# Patient Record
Sex: Female | Born: 2000 | Race: White | Hispanic: No | Marital: Single | Smoking: Former smoker
Health system: Southern US, Community
[De-identification: ages and names within clinical notes are randomized; demographics above are authoritative.]

## PROBLEM LIST (undated history)

## (undated) DIAGNOSIS — F419 Anxiety disorder, unspecified: Secondary | ICD-10-CM

## (undated) DIAGNOSIS — F329 Major depressive disorder, single episode, unspecified: Secondary | ICD-10-CM

## (undated) DIAGNOSIS — E162 Hypoglycemia, unspecified: Secondary | ICD-10-CM

## (undated) DIAGNOSIS — F5002 Anorexia nervosa, binge eating/purging type: Secondary | ICD-10-CM

## (undated) DIAGNOSIS — F32A Depression, unspecified: Secondary | ICD-10-CM

## (undated) DIAGNOSIS — F509 Eating disorder, unspecified: Secondary | ICD-10-CM

## (undated) DIAGNOSIS — N938 Other specified abnormal uterine and vaginal bleeding: Secondary | ICD-10-CM

## (undated) DIAGNOSIS — F50029 Anorexia nervosa, binge eating/purging type, unspecified: Secondary | ICD-10-CM

## (undated) DIAGNOSIS — N83209 Unspecified ovarian cyst, unspecified side: Secondary | ICD-10-CM

## (undated) DIAGNOSIS — G43909 Migraine, unspecified, not intractable, without status migrainosus: Secondary | ICD-10-CM

## (undated) DIAGNOSIS — F909 Attention-deficit hyperactivity disorder, unspecified type: Secondary | ICD-10-CM

## (undated) HISTORY — PX: INTRAUTERINE DEVICE INSERTION: SHX323

## (undated) HISTORY — PX: TYMPANOSTOMY TUBE PLACEMENT: SHX32

---
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
## 2020-01-29 ENCOUNTER — Other Ambulatory Visit (INDEPENDENT_AMBULATORY_CARE_PROVIDER_SITE_OTHER): Payer: Self-pay | Admitting: Family

## 2020-01-29 DIAGNOSIS — G43009 Migraine without aura, not intractable, without status migrainosus: Secondary | ICD-10-CM

## 2020-03-30 ENCOUNTER — Other Ambulatory Visit (INDEPENDENT_AMBULATORY_CARE_PROVIDER_SITE_OTHER): Payer: Self-pay | Admitting: Family

## 2020-03-30 DIAGNOSIS — G43009 Migraine without aura, not intractable, without status migrainosus: Secondary | ICD-10-CM

## 2020-04-28 ENCOUNTER — Other Ambulatory Visit (INDEPENDENT_AMBULATORY_CARE_PROVIDER_SITE_OTHER): Payer: Self-pay | Admitting: Family

## 2020-04-28 ENCOUNTER — Other Ambulatory Visit (INDEPENDENT_AMBULATORY_CARE_PROVIDER_SITE_OTHER): Payer: Self-pay

## 2020-04-28 DIAGNOSIS — G43009 Migraine without aura, not intractable, without status migrainosus: Secondary | ICD-10-CM

## 2020-06-05 ENCOUNTER — Other Ambulatory Visit (INDEPENDENT_AMBULATORY_CARE_PROVIDER_SITE_OTHER): Payer: Self-pay | Admitting: Family

## 2020-06-05 DIAGNOSIS — G43009 Migraine without aura, not intractable, without status migrainosus: Secondary | ICD-10-CM

## 2020-06-05 NOTE — Telephone Encounter (Signed)
Please send refill to pharmacy if applicable

## 2020-07-16 ENCOUNTER — Other Ambulatory Visit (INDEPENDENT_AMBULATORY_CARE_PROVIDER_SITE_OTHER): Payer: Self-pay | Admitting: Family

## 2020-07-16 DIAGNOSIS — G43009 Migraine without aura, not intractable, without status migrainosus: Secondary | ICD-10-CM

## 2020-09-21 ENCOUNTER — Other Ambulatory Visit (INDEPENDENT_AMBULATORY_CARE_PROVIDER_SITE_OTHER): Payer: Self-pay | Admitting: Family

## 2020-09-21 DIAGNOSIS — G43009 Migraine without aura, not intractable, without status migrainosus: Secondary | ICD-10-CM

## 2020-10-19 ENCOUNTER — Other Ambulatory Visit (INDEPENDENT_AMBULATORY_CARE_PROVIDER_SITE_OTHER): Payer: Self-pay | Admitting: Family

## 2020-10-19 DIAGNOSIS — G43009 Migraine without aura, not intractable, without status migrainosus: Secondary | ICD-10-CM

## 2021-02-25 ENCOUNTER — Encounter (INDEPENDENT_AMBULATORY_CARE_PROVIDER_SITE_OTHER): Payer: Self-pay

## 2021-02-26 ENCOUNTER — Encounter (HOSPITAL_BASED_OUTPATIENT_CLINIC_OR_DEPARTMENT_OTHER): Payer: Self-pay | Admitting: *Deleted

## 2021-02-26 ENCOUNTER — Other Ambulatory Visit: Payer: Self-pay

## 2021-02-26 ENCOUNTER — Emergency Department (HOSPITAL_BASED_OUTPATIENT_CLINIC_OR_DEPARTMENT_OTHER)
Admission: EM | Admit: 2021-02-26 | Discharge: 2021-02-26 | Disposition: A | Discharge status: Home / Self Care | Payer: Medicaid Other | Attending: Emergency Medicine | Admitting: Emergency Medicine

## 2021-02-26 DIAGNOSIS — F17210 Nicotine dependence, cigarettes, uncomplicated: Secondary | ICD-10-CM | POA: Diagnosis not present

## 2021-02-26 DIAGNOSIS — T2040XA Corrosion of unspecified degree of head, face, and neck, unspecified site, initial encounter: Secondary | ICD-10-CM | POA: Diagnosis not present

## 2021-02-26 DIAGNOSIS — T304 Corrosion of unspecified body region, unspecified degree: Secondary | ICD-10-CM

## 2021-02-26 DIAGNOSIS — L539 Erythematous condition, unspecified: Secondary | ICD-10-CM | POA: Insufficient documentation

## 2021-02-26 DIAGNOSIS — T656X1A Toxic effect of paints and dyes, not elsewhere classified, accidental (unintentional), initial encounter: Secondary | ICD-10-CM | POA: Diagnosis not present

## 2021-02-26 DIAGNOSIS — X58XXXA Exposure to other specified factors, initial encounter: Secondary | ICD-10-CM | POA: Insufficient documentation

## 2021-02-26 DIAGNOSIS — Y929 Unspecified place or not applicable: Secondary | ICD-10-CM | POA: Insufficient documentation

## 2021-02-26 MED ORDER — TRIAMCINOLONE ACETONIDE 0.1 % EX CREA: 1.0000 "application " | TOPICAL_CREAM | Freq: Two times a day (BID) | CUTANEOUS | 0 refills | Status: AC
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
## 2021-03-20 ENCOUNTER — Emergency Department (HOSPITAL_BASED_OUTPATIENT_CLINIC_OR_DEPARTMENT_OTHER)
Admission: EM | Admit: 2021-03-20 | Discharge: 2021-03-20 | Disposition: A | Discharge status: Home / Self Care | Payer: Medicaid Other | Attending: Emergency Medicine | Admitting: Emergency Medicine

## 2021-03-20 ENCOUNTER — Other Ambulatory Visit: Payer: Self-pay

## 2021-03-20 ENCOUNTER — Emergency Department (HOSPITAL_BASED_OUTPATIENT_CLINIC_OR_DEPARTMENT_OTHER): Payer: Medicaid Other

## 2021-03-20 ENCOUNTER — Encounter (HOSPITAL_BASED_OUTPATIENT_CLINIC_OR_DEPARTMENT_OTHER): Payer: Self-pay | Admitting: *Deleted

## 2021-03-20 DIAGNOSIS — R112 Nausea with vomiting, unspecified: Secondary | ICD-10-CM | POA: Insufficient documentation

## 2021-03-20 DIAGNOSIS — F17210 Nicotine dependence, cigarettes, uncomplicated: Secondary | ICD-10-CM | POA: Insufficient documentation

## 2021-03-20 DIAGNOSIS — G43001 Migraine without aura, not intractable, with status migrainosus: Secondary | ICD-10-CM

## 2021-03-20 DIAGNOSIS — R519 Headache, unspecified: Secondary | ICD-10-CM | POA: Diagnosis not present

## 2021-03-20 MED ORDER — SODIUM CHLORIDE 0.9 % IV BOLUS
1000.0000 mL | Freq: Once | INTRAVENOUS | Status: AC
  Administered 2021-03-20: 1000 mL via INTRAVENOUS

## 2021-03-20 MED ORDER — HYDROMORPHONE HCL 1 MG/ML IJ SOLN
0.5000 mg | Freq: Once | INTRAMUSCULAR | Status: AC
  Administered 2021-03-20: 0.5 mg via INTRAVENOUS
  Filled 2021-03-20: qty 1

## 2021-03-20 MED ORDER — PROMETHAZINE HCL 25 MG/ML IJ SOLN
INTRAMUSCULAR | Status: AC
  Administered 2021-03-20: 12.5 mg
  Filled 2021-03-20: qty 1

## 2021-03-20 MED ORDER — SUMATRIPTAN SUCCINATE 6 MG/0.5ML ~~LOC~~ SOLN
6.0000 mg | Freq: Once | SUBCUTANEOUS | Status: AC
  Administered 2021-03-20: 6 mg via SUBCUTANEOUS
  Filled 2021-03-20: qty 0.5

## 2021-03-20 MED ORDER — ONDANSETRON HCL 4 MG/2ML IJ SOLN
4.0000 mg | Freq: Once | INTRAMUSCULAR | Status: AC
  Administered 2021-03-20: 4 mg via INTRAVENOUS
  Filled 2021-03-20: qty 2

## 2021-03-20 MED ORDER — LORAZEPAM 2 MG/ML IJ SOLN
0.5000 mg | Freq: Once | INTRAMUSCULAR | Status: AC
  Administered 2021-03-20: 0.5 mg via INTRAVENOUS
  Filled 2021-03-20: qty 1

## 2021-03-20 MED ORDER — KETOROLAC TROMETHAMINE 30 MG/ML IJ SOLN
15.0000 mg | Freq: Once | INTRAMUSCULAR | Status: AC
  Administered 2021-03-20: 15 mg via INTRAVENOUS
  Filled 2021-03-20: qty 1

## 2021-03-20 MED ORDER — SODIUM CHLORIDE 0.9 % IV SOLN
12.5000 mg | Freq: Once | INTRAVENOUS | Status: DC
  Filled 2021-03-20: qty 0.5

## 2021-03-20 NOTE — ED Notes (Signed)
Patient transported to CT 

## 2021-03-20 NOTE — ED Provider Notes (Addendum)
[facility] EMERGENCY DEPARTMENT Provider Note   CSN: [id number] Arrival date & time: 03/20/21  2016     History Chief Complaint  Patient presents with  . Headache    Amanda Golden is a 20 y.o. female.  Patient with hx migraines c/o dull frontal headache, c/w prior migraines, onset two days ago. Symptoms acute onset, moderate, persistent, constant. No abrupt, thunderclap, or worst headache type of headaches. No neck pain or stiffness. No sinus drainage or sinus pain. No sore throat or runny nose. No cough. +nausea. States vomited earlier, not bloody or bilious. No abd pain or diarrhea. Tried otc meds without relief. No fever or chills. No recent head trauma or injury. No syncope. No eye pain or change in vision. No neck pain or stiffness. No associated numbness or weakness. No problems w balance, coordination or normal functional ability.   The history is provided by the patient.  Headache Associated symptoms: nausea and vomiting   Associated symptoms: no abdominal pain, no back pain, no eye pain, no fever, no neck pain, no neck stiffness, no numbness and no weakness        Past Medical History:  Diagnosis Date  . ADHD (attention deficit hyperactivity disorder)   . Anorexia nervosa with bulimia   . Anxiety   . Depression   . DUB (dysfunctional uterine bleeding)   . Eating disorder   . Hypoglycemia   . Migraines   . Ovarian cyst     Patient Active Problem List   Diagnosis Date Noted  . History of suicidal ideation 09/30/2017  . Depression 09/25/2017  . Migraine without aura and without status migrainosus, not intractable 07/25/2017  . Tension type headache 07/25/2017  . History of cholesteatoma left ear 07/25/2017  . Disrupted sleep-wake cycle 07/25/2017  . Anxiety 07/25/2017  . Eating disorder 07/25/2017  . ADHD (attention deficit hyperactivity disorder) 07/25/2017  . Problems with learning 07/25/2017    Past Surgical History:  Procedure Laterality Date   . INTRAUTERINE DEVICE INSERTION    . TYMPANOSTOMY TUBE PLACEMENT       OB History   No obstetric history on file.     Family History  Problem Relation Age of Onset  . Migraines Mother   . ADD / ADHD Mother   . Migraines Father   . Bipolar disorder Father   . ADD / ADHD Father   . Migraines Maternal Grandmother   . Bipolar disorder Maternal Grandmother   . ADD / ADHD Maternal Grandmother   . Migraines Maternal Grandfather   . Migraines Paternal Grandmother   . ADD / ADHD Paternal Grandmother     Social History   Tobacco Use  . Smoking status: Current Every Day Smoker    Types: Cigarettes  . Smokeless tobacco: Never Used  Vaping Use  . Vaping Use: Every day  Substance Use Topics  . Alcohol use: Not Currently  . Drug use: Never    Home Medications Prior to Admission medications   Medication Sig Start Date End Date Taking? Authorizing Provider  acetaminophen (TYLENOL) 325 MG tablet Take 2 tablets (650 mg total) by mouth every 6 (six) hours as needed. 09/25/18   Mesner, Barbara CowerJason, MD  albuterol (VENTOLIN HFA) 108 (90 Base) MCG/ACT inhaler Inhale into the lungs. 09/24/19   [provider]  cetirizine (ZYRTEC) 10 MG tablet Take 10 mg by mouth daily as needed. 11/04/19   [provider]  Dexmethylphenidate HCl (FOCALIN XR) 30 MG CP24 Take 2 capsules every morning  09/25/17   Elveria Rising, NP  DULoxetine (CYMBALTA) 30 MG capsule TAKE 1 CAPSULE BY MOUTH ONCE DAILY 07/15/19   [provider]  esomeprazole (NEXIUM) 20 MG capsule Take by mouth.    [provider]  etonogestrel (NEXPLANON) 68 MG IMPL implant 1 each by Subdermal route once.    [provider]  ibuprofen (ADVIL) 400 MG tablet Take 1 tablet (400 mg total) by mouth as needed. 04/29/19   Elveria Rising, NP  lactulose (CHRONULAC) 10 GM/15ML solution Take by mouth 3 (three) times daily.    [provider]  loratadine (CLARITIN) 10 MG tablet Take 10 mg by mouth daily.     [provider]  magnesium hydroxide (MILK OF MAGNESIA) 400 MG/5ML suspension Take by mouth. 09/05/17   [provider]  ondansetron (ZOFRAN-ODT) 4 MG disintegrating tablet DISSOLVE 1 TABLET IN MOUTH AT ONSET OF NAUSEA, MAY REPEAT 1 TABLET IN 6 HOURS AS NEEDED 07/16/20   Elveria Rising, NP  promethazine (PHENERGAN) 25 MG tablet TAKE 1 TABLET BY MOUTH EVERY 6 HOURS AS NEEDED FOR NAUSEA AND VOMITING . APPOINTMENT REQUIRED FOR FUTURE REFILLS 10/19/20   Elveria Rising, NP  tiZANidine (ZANAFLEX) 4 MG tablet TAKE 1 TABLET BY MOUTH AT BEDTIME WHEN HEADACHE IS SEVERE 07/16/20   Elveria Rising, NP  triamcinolone cream (KENALOG) 0.1 % Apply 1 application topically 2 (two) times daily. Do not use on face for more than 5 days 02/26/21   Renne Crigler, PA-C    Allergies    Morphine and related, Compazine [prochlorperazine], Decadron [dexamethasone], and Metoclopramide  Review of Systems   Review of Systems  Constitutional: Negative for chills and fever.  Eyes: Negative for pain, redness and visual disturbance.  Respiratory: Negative for shortness of breath.   Cardiovascular: Negative for chest pain.  Gastrointestinal: Positive for nausea and vomiting. Negative for abdominal pain.  Genitourinary: Negative for flank pain.  Musculoskeletal: Negative for back pain, neck pain and neck stiffness.  Skin: Negative for rash.  Neurological: Positive for headaches. Negative for syncope, speech difficulty, weakness and numbness.  Hematological: Does not bruise/bleed easily.  Psychiatric/Behavioral: Negative for confusion.    Physical Exam Updated Vital Signs BP 115/76 (BP Location: Right Arm)   Pulse 68   Temp 97.9 F (36.6 C) (Oral)   Resp 16   Ht 1.549 m (5\' 1" )   Wt 61.2 kg   LMP 03/06/2021 (Approximate)   SpO2 100%   BMI 25.51 kg/m   Physical Exam Vitals and nursing note reviewed.  Constitutional:      General: She is not in acute distress.    Appearance: Normal  appearance. She is well-developed. She is not diaphoretic.  HENT:     Head: Atraumatic.     Comments: No sinus or temporal tenderness.     Nose: Nose normal.     Mouth/Throat:     Mouth: Mucous membranes are moist.  Eyes:     General: No scleral icterus.    Extraocular Movements: Extraocular movements intact.     Conjunctiva/sclera: Conjunctivae normal.     Pupils: Pupils are equal, round, and reactive to light.  Neck:     Thyroid: No thyromegaly.     Trachea: No tracheal deviation.     Comments: No stiffness or rigidity.  Cardiovascular:     Rate and Rhythm: Normal rate and regular rhythm.     Pulses: Normal pulses.     Heart sounds: Normal heart sounds. No murmur heard. No friction rub. No gallop.  Pulmonary:     Effort: Pulmonary effort is normal. No respiratory distress.     Breath sounds: Normal breath sounds.  Abdominal:     General: Bowel sounds are normal. There is no distension.     Palpations: Abdomen is soft.     Tenderness: There is no abdominal tenderness.  Genitourinary:    Comments: No cva tenderness.  Musculoskeletal:        General: No swelling or tenderness. Normal range of motion.     Cervical back: Normal range of motion and neck supple. No rigidity. No muscular tenderness.  Skin:    General: Skin is warm and dry.     Findings: No rash.  Neurological:     General: No focal deficit present.     Mental Status: She is alert and oriented to person, place, and time.     Cranial Nerves: No cranial nerve deficit.     Comments: Alert, speech normal. Motor intact bil, stre 5/5. Sens grossly intact bil. Steady gait.   Psychiatric:        Mood and Affect: Mood normal.     ED Results / Procedures / Treatments   Labs (all labs ordered are listed, but only abnormal results are displayed) Labs Reviewed - No data to display  EKG None  Radiology CT HEAD WO CONTRAST  Result Date: 03/20/2021 CLINICAL DATA:  Worsening headache for 2 days, vomiting EXAM: CT  HEAD WITHOUT CONTRAST TECHNIQUE: Contiguous axial images were obtained from the base of the skull through the vertex without intravenous contrast. COMPARISON:  09/28/2017 FINDINGS: Brain: No acute infarct or hemorrhage. Lateral ventricles and midline structures are unremarkable. No acute extra-axial fluid collections. No mass effect. Vascular: No hyperdense vessel or unexpected calcification. Skull: Normal. Negative for fracture or focal lesion. Sinuses/Orbits: No acute finding. Other: None. IMPRESSION: 1. No acute intracranial process. Electronically Signed   By: Sharlet Salina M.D.   On: 03/20/2021 22:17    Procedures Procedures   Medications Ordered in ED Medications  sodium chloride 0.9 % bolus 1,000 mL (has no administration in time range)  ondansetron (ZOFRAN) injection 4 mg (has no administration in time range)  HYDROmorphone (DILAUDID) injection 0.5 mg (has no administration in time range)  SUMAtriptan (IMITREX) injection 6 mg (has no administration in time range)    ED Course  I have reviewed the triage vital signs and the nursing notes.  Pertinent labs & imaging results that were available during my care of the patient were reviewed by me and considered in my medical decision making (see chart for details).    MDM Rules/Calculators/A&P                         Pt requests ivf/meds. Iv ns bolus. zofran iv. Dilaudid iv. imitrex sq.   Reviewed nursing notes and prior charts for additional history.  Prior imaging for headaches noted - neg acute then.   Pt indicates earlier meds havent helped, requests phenergan. Phenergan 12.5 mg iv. Ativan .5 mg iv.   CT reviewed/interpreted by me - no hem. No sinusitis.   Recheck pt, calm, alert, no distress. Po fluids.   No vomiting in ED. Pt comfortable appearing. Vitals normal.  Patient currently appears stable for d/c.   rec pcp f/u.  Return precautions provided.   Final Clinical Impression(s) / ED Diagnoses Final diagnoses:  None     Rx / DC Orders ED Discharge Orders    None  Cathren Laine, MD 03/20/21 [phone]

## 2021-03-20 NOTE — ED Notes (Addendum)
Given sprite.

## 2021-03-20 NOTE — Discharge Instructions (Addendum)
It was our pleasure to provide your ER care today - we hope that you feel better.  Rest. Drink plenty of fluids/stay well hydrated.  Take your medication as need.   Follow up with your doctor in the next 2-3 days if symptoms fail to improve/resolve.  Return to ER if worse, new symptoms, fevers, new, severe or intractable pain, persistent vomiting, or other concern.  You were given medication in the ER - no driving for the next 8 hours.

## 2021-03-20 NOTE — ED Triage Notes (Signed)
Headache x 2 days. States has tried ibuprofen, benadryl, tizanidine, tylenol, aleve, and Excedrin migraine without relief. Vomited x 3 today

## 2021-03-25 ENCOUNTER — Encounter (HOSPITAL_BASED_OUTPATIENT_CLINIC_OR_DEPARTMENT_OTHER): Payer: Self-pay | Admitting: *Deleted

## 2021-03-25 ENCOUNTER — Emergency Department (HOSPITAL_BASED_OUTPATIENT_CLINIC_OR_DEPARTMENT_OTHER)
Admission: EM | Admit: 2021-03-25 | Discharge: 2021-03-26 | Disposition: A | Discharge status: Home / Self Care | Payer: Medicaid Other | Attending: Emergency Medicine | Admitting: Emergency Medicine

## 2021-03-25 ENCOUNTER — Other Ambulatory Visit: Payer: Self-pay

## 2021-03-25 DIAGNOSIS — F17210 Nicotine dependence, cigarettes, uncomplicated: Secondary | ICD-10-CM | POA: Diagnosis not present

## 2021-03-25 DIAGNOSIS — Z20822 Contact with and (suspected) exposure to covid-19: Secondary | ICD-10-CM | POA: Diagnosis not present

## 2021-03-25 DIAGNOSIS — J029 Acute pharyngitis, unspecified: Secondary | ICD-10-CM | POA: Diagnosis not present

## 2021-03-25 DIAGNOSIS — R070 Pain in throat: Secondary | ICD-10-CM | POA: Diagnosis present

## 2021-03-25 LAB — GROUP A STREP BY PCR: Group A Strep by PCR: NOT DETECTED

## 2021-03-25 MED ORDER — KETOROLAC TROMETHAMINE 30 MG/ML IJ SOLN
30.0000 mg | Freq: Once | INTRAMUSCULAR | Status: AC
  Administered 2021-03-25: 30 mg via INTRAMUSCULAR
  Filled 2021-03-25: qty 1

## 2021-03-25 NOTE — ED Notes (Signed)
ED Provider at bedside. 

## 2021-03-25 NOTE — ED Triage Notes (Addendum)
C/o tonsilar stones pt removed 2 herself  And 1 remains  x 3 days

## 2021-03-25 NOTE — Discharge Instructions (Addendum)
Please read instructions below.  You can alternate Tylenol/acetaminophen and Advil/ibuprofen/Motrin every 4 hours for sore throat, body aches, headache or fever.  Drink plenty of water.  Use saline nasal spray for congestion. Wash your hands frequently. You have a COVID test pending. Please isolate at home while awaiting your results.  You can follow your results on MyChart. > If your test is negative, stay home until your fever has resolved/your symptoms are improving. > If your test is positive, isolate at home for at least 7 days after the day your symptoms initially began, and THEN at least 24 hours after you are fever-free without the help of medications AND your symptoms are improving. Only once your symptoms are improving and you are fever-free can you come out out of quarantine. Once, then you should wear a mask in public for another 5 days. Follow up with your primary care provider. Return to the ER for significant shortness of breath, uncontrollable vomiting, severe chest pain, or other concerning symptoms.

## 2021-03-25 NOTE — ED Provider Notes (Signed)
[facility] EMERGENCY DEPARTMENT Provider Note   CSN: [id number] Arrival date & time: 03/25/21  2025     History Chief Complaint  Patient presents with  . Sore Throat    Amanda Golden is a 20 y.o. female presenting for evaluation of sore throat that began 2 to 3 days ago.  Patient states she thought she may have had a tonsil stone, she was trying to remove them.  She has associated runny nose and pain with swallowing.  Some intermittent cough.  No fevers or chills.  No known COVID exposures.  She is completed only 1 of 2 COVID vaccines.  Has not treated her symptoms prior to arrival.  The history is provided by the patient.       Past Medical History:  Diagnosis Date  . ADHD (attention deficit hyperactivity disorder)   . Anorexia nervosa with bulimia   . Anxiety   . Depression   . DUB (dysfunctional uterine bleeding)   . Eating disorder   . Hypoglycemia   . Migraines   . Ovarian cyst     Patient Active Problem List   Diagnosis Date Noted  . History of suicidal ideation 09/30/2017  . Depression 09/25/2017  . Migraine without aura and without status migrainosus, not intractable 07/25/2017  . Tension type headache 07/25/2017  . History of cholesteatoma left ear 07/25/2017  . Disrupted sleep-wake cycle 07/25/2017  . Anxiety 07/25/2017  . Eating disorder 07/25/2017  . ADHD (attention deficit hyperactivity disorder) 07/25/2017  . Problems with learning 07/25/2017    Past Surgical History:  Procedure Laterality Date  . INTRAUTERINE DEVICE INSERTION    . TYMPANOSTOMY TUBE PLACEMENT       OB History   No obstetric history on file.     Family History  Problem Relation Age of Onset  . Migraines Mother   . ADD / ADHD Mother   . Migraines Father   . Bipolar disorder Father   . ADD / ADHD Father   . Migraines Maternal Grandmother   . Bipolar disorder Maternal Grandmother   . ADD / ADHD Maternal Grandmother   . Migraines Maternal Grandfather   .  Migraines Paternal Grandmother   . ADD / ADHD Paternal Grandmother     Social History   Tobacco Use  . Smoking status: Current Every Day Smoker    Types: Cigarettes  . Smokeless tobacco: Never Used  Vaping Use  . Vaping Use: Every day  Substance Use Topics  . Alcohol use: Not Currently  . Drug use: Never    Home Medications Prior to Admission medications   Medication Sig Start Date End Date Taking? Authorizing Provider  acetaminophen (TYLENOL) 325 MG tablet Take 2 tablets (650 mg total) by mouth every 6 (six) hours as needed. 09/25/18   Mesner, Barbara Cower, MD  albuterol (VENTOLIN HFA) 108 (90 Base) MCG/ACT inhaler Inhale into the lungs. 09/24/19   [provider]  cetirizine (ZYRTEC) 10 MG tablet Take 10 mg by mouth daily as needed. 11/04/19   [provider]  Dexmethylphenidate HCl (FOCALIN XR) 30 MG CP24 Take 2 capsules every morning 09/25/17   Elveria Rising, NP  DULoxetine (CYMBALTA) 30 MG capsule TAKE 1 CAPSULE BY MOUTH ONCE DAILY 07/15/19   [provider]  esomeprazole (NEXIUM) 20 MG capsule Take by mouth.    [provider]  etonogestrel (NEXPLANON) 68 MG IMPL implant 1 each by Subdermal route once.    [provider]  ibuprofen (ADVIL) 400 MG tablet Take  1 tablet (400 mg total) by mouth as needed. 04/29/19   Elveria Rising, NP  lactulose (CHRONULAC) 10 GM/15ML solution Take by mouth 3 (three) times daily.    [provider]  loratadine (CLARITIN) 10 MG tablet Take 10 mg by mouth daily.    [provider]  magnesium hydroxide (MILK OF MAGNESIA) 400 MG/5ML suspension Take by mouth. 09/05/17   [provider]  ondansetron (ZOFRAN-ODT) 4 MG disintegrating tablet DISSOLVE 1 TABLET IN MOUTH AT ONSET OF NAUSEA, MAY REPEAT 1 TABLET IN 6 HOURS AS NEEDED 07/16/20   Elveria Rising, NP  promethazine (PHENERGAN) 25 MG tablet TAKE 1 TABLET BY MOUTH EVERY 6 HOURS AS NEEDED FOR NAUSEA AND VOMITING . APPOINTMENT REQUIRED FOR  FUTURE REFILLS 10/19/20   Elveria Rising, NP  tiZANidine (ZANAFLEX) 4 MG tablet TAKE 1 TABLET BY MOUTH AT BEDTIME WHEN HEADACHE IS SEVERE 07/16/20   Elveria Rising, NP  triamcinolone cream (KENALOG) 0.1 % Apply 1 application topically 2 (two) times daily. Do not use on face for more than 5 days 02/26/21   Renne Crigler, PA-C    Allergies    Morphine and related, Compazine [prochlorperazine], Decadron [dexamethasone], and Metoclopramide  Review of Systems   Review of Systems  HENT: Positive for congestion and sore throat.   All other systems reviewed and are negative.   Physical Exam Updated Vital Signs BP 109/70 (BP Location: Right Arm)   Pulse 75   Temp (!) 97.5 F (36.4 C) (Oral)   Resp 18   Ht 5\' 1"  (1.549 m)   Wt 63.5 kg   LMP 03/06/2021 (Approximate)   SpO2 100%   BMI 26.45 kg/m   Physical Exam Vitals and nursing note reviewed.  Constitutional:      General: She is not in acute distress.    Appearance: She is well-developed.  HENT:     Head: Normocephalic and atraumatic.     Right Ear: Tympanic membrane and ear canal normal.     Left Ear: Ear canal normal. A middle ear effusion (serous) is present.     Nose: Congestion present.     Mouth/Throat:     Mouth: Mucous membranes are moist.     Pharynx: Uvula midline. Posterior oropharyngeal erythema present. No oropharyngeal exudate.     Comments: Mild bilateral tonsillar edema without exudates.  Uvula is midline, no trismus.  Tolerating secretions. Eyes:     Conjunctiva/sclera: Conjunctivae normal.  Cardiovascular:     Rate and Rhythm: Normal rate and regular rhythm.  Pulmonary:     Effort: Pulmonary effort is normal. No respiratory distress.     Breath sounds: Normal breath sounds.  Musculoskeletal:     Cervical back: Normal range of motion and neck supple. No tenderness.  Lymphadenopathy:     Cervical: No cervical adenopathy.  Neurological:     Mental Status: She is alert.  Psychiatric:        Mood and  Affect: Mood normal.        Behavior: Behavior normal.     ED Results / Procedures / Treatments   Labs (all labs ordered are listed, but only abnormal results are displayed) Labs Reviewed  GROUP A STREP BY PCR  SARS CORONAVIRUS 2 (TAT 6-24 HRS)    EKG None  Radiology No results found.  Procedures Procedures   Medications Ordered in ED Medications  ketorolac (TORADOL) 30 MG/ML injection 30 mg (30 mg Intramuscular Given 03/25/21 2241)    ED Course  I have reviewed the triage  vital signs and the nursing notes.  Pertinent labs & imaging results that were available during my care of the patient were reviewed by me and considered in my medical decision making (see chart for details).    MDM Rules/Calculators/A&P                          Patient presenting for evaluation of sore throat, congestion, cough x3 days.  Exam is not concerning for peritonsillar abscess, mild tonsillar edema without exudates.  Tolerating secretions.  She is afebrile.  Lungs are clear.  Strep swab is negative.  COVID swab is sent and pending.  She is aware she is pending COVID swab and is to follow result on MyChart.  Symptomatic management and home isolation precautions discussed.  Pain treated with Toradol.  Discussed importance of oral hydration, over-the-counter symptomatic management.  Patient discharged in no distress.  Discussed results, findings, treatment and follow up. Patient advised of return precautions. Patient verbalized understanding and agreed with plan.  Amanda Golden was evaluated in Emergency Department on 03/25/2021 for the symptoms described in the history of present illness. She was evaluated in the context of the global COVID-19 pandemic, which necessitated consideration that the patient might be at risk for infection with the SARS-CoV-2 virus that causes COVID-19. Institutional protocols and algorithms that pertain to the evaluation of patients at risk for COVID-19 are in a state of rapid  change based on information released by regulatory bodies including the CDC and federal and state organizations. These policies and algorithms were followed during the patient's care in the ED.    Final Clinical Impression(s) / ED Diagnoses Final diagnoses:  Viral pharyngitis    Rx / DC Orders ED Discharge Orders    None       Eddye Broxterman, [place] N, PA-C 03/25/21 2346    Tegeler, Canary Brim, MD 03/25/21 2357

## 2021-03-26 ENCOUNTER — Telehealth (HOSPITAL_COMMUNITY): Payer: Self-pay

## 2021-03-26 LAB — SARS CORONAVIRUS 2 (TAT 6-24 HRS): SARS Coronavirus 2: NEGATIVE

## 2021-04-04 ENCOUNTER — Emergency Department (HOSPITAL_BASED_OUTPATIENT_CLINIC_OR_DEPARTMENT_OTHER)
Admission: EM | Admit: 2021-04-04 | Discharge: 2021-04-04 | Disposition: A | Discharge status: Home / Self Care | Payer: Medicaid Other | Attending: Emergency Medicine | Admitting: Emergency Medicine

## 2021-04-04 ENCOUNTER — Encounter (HOSPITAL_BASED_OUTPATIENT_CLINIC_OR_DEPARTMENT_OTHER): Payer: Self-pay | Admitting: Emergency Medicine

## 2021-04-04 ENCOUNTER — Emergency Department (HOSPITAL_BASED_OUTPATIENT_CLINIC_OR_DEPARTMENT_OTHER): Payer: Medicaid Other

## 2021-04-04 ENCOUNTER — Other Ambulatory Visit: Payer: Self-pay

## 2021-04-04 DIAGNOSIS — S4992XA Unspecified injury of left shoulder and upper arm, initial encounter: Secondary | ICD-10-CM | POA: Diagnosis present

## 2021-04-04 DIAGNOSIS — W010XXA Fall on same level from slipping, tripping and stumbling without subsequent striking against object, initial encounter: Secondary | ICD-10-CM | POA: Diagnosis not present

## 2021-04-04 DIAGNOSIS — S63502A Unspecified sprain of left wrist, initial encounter: Secondary | ICD-10-CM

## 2021-04-04 DIAGNOSIS — W19XXXA Unspecified fall, initial encounter: Secondary | ICD-10-CM

## 2021-04-04 DIAGNOSIS — Y92016 Swimming-pool in single-family (private) house or garden as the place of occurrence of the external cause: Secondary | ICD-10-CM | POA: Insufficient documentation

## 2021-04-04 DIAGNOSIS — R202 Paresthesia of skin: Secondary | ICD-10-CM | POA: Insufficient documentation

## 2021-04-04 DIAGNOSIS — S5002XA Contusion of left elbow, initial encounter: Secondary | ICD-10-CM

## 2021-04-04 DIAGNOSIS — S43402A Unspecified sprain of left shoulder joint, initial encounter: Secondary | ICD-10-CM | POA: Diagnosis not present

## 2021-04-04 DIAGNOSIS — S6392XA Sprain of unspecified part of left wrist and hand, initial encounter: Secondary | ICD-10-CM | POA: Diagnosis not present

## 2021-04-04 DIAGNOSIS — F17210 Nicotine dependence, cigarettes, uncomplicated: Secondary | ICD-10-CM | POA: Diagnosis not present

## 2021-04-04 MED ORDER — ACETAMINOPHEN 500 MG PO TABS
1000.0000 mg | ORAL_TABLET | Freq: Once | ORAL | Status: AC
  Administered 2021-04-04: 1000 mg via ORAL
  Filled 2021-04-04: qty 2

## 2021-04-04 MED ORDER — IBUPROFEN 400 MG PO TABS
400.0000 mg | ORAL_TABLET | Freq: Once | ORAL | Status: AC
  Administered 2021-04-04: 400 mg via ORAL
  Filled 2021-04-04: qty 1

## 2021-04-04 NOTE — ED Provider Notes (Signed)
[facility] EMERGENCY DEPARTMENT Provider Note   CSN: [id number] Arrival date & time: 04/04/21  1821     History Chief Complaint  Patient presents with   Arm Injury    Amanda Golden is a 20 y.o. female.  The history is provided by the patient.  Arm Injury Location:  Shoulder, elbow and wrist Shoulder location:  L shoulder Elbow location:  L elbow Wrist location:  L wrist Injury: yes   Time since incident:  1 hour Mechanism of injury: fall   Fall:    Fall occurred: She was getting out of the pool today and she tripped and fell trying to catch herself on her left arm.   Impact surface: ground.   Point of impact:  Outstretched arms Pain details:    Quality:  Shooting and throbbing   Radiates to:  Does not radiate   Severity:  Moderate   Onset quality:  Sudden   Timing:  Constant   Progression:  Unchanged Foreign body present:  No foreign bodies Prior injury to area:  No Relieved by:  Nothing Worsened by:  Movement and stretching area Ineffective treatments:  None tried Associated symptoms: decreased range of motion and tingling   Associated symptoms comment:  Tingling of the left fingers.  Wrist initially was hurting badly but now starting to feel better     Past Medical History:  Diagnosis Date   ADHD (attention deficit hyperactivity disorder)    Anorexia nervosa with bulimia    Anxiety    Depression    DUB (dysfunctional uterine bleeding)    Eating disorder    Hypoglycemia    Migraines    Ovarian cyst     Patient Active Problem List   Diagnosis Date Noted   History of suicidal ideation 09/30/2017   Depression 09/25/2017   Migraine without aura and without status migrainosus, not intractable 07/25/2017   Tension type headache 07/25/2017   History of cholesteatoma left ear 07/25/2017   Disrupted sleep-wake cycle 07/25/2017   Anxiety 07/25/2017   Eating disorder 07/25/2017   ADHD (attention deficit hyperactivity disorder) 07/25/2017    Problems with learning 07/25/2017    Past Surgical History:  Procedure Laterality Date   INTRAUTERINE DEVICE INSERTION     TYMPANOSTOMY TUBE PLACEMENT       OB History   No obstetric history on file.     Family History  Problem Relation Age of Onset   Migraines Mother    ADD / ADHD Mother    Migraines Father    Bipolar disorder Father    ADD / ADHD Father    Migraines Maternal Grandmother    Bipolar disorder Maternal Grandmother    ADD / ADHD Maternal Grandmother    Migraines Maternal Grandfather    Migraines Paternal Grandmother    ADD / ADHD Paternal Grandmother     Social History   Tobacco Use   Smoking status: Every Day    Pack years: 0.00    Types: Cigarettes   Smokeless tobacco: Never  Vaping Use   Vaping Use: Every day  Substance Use Topics   Alcohol use: Not Currently   Drug use: Never    Home Medications Prior to Admission medications   Medication Sig Start Date End Date Taking? Authorizing Provider  acetaminophen (TYLENOL) 325 MG tablet Take 2 tablets (650 mg total) by mouth every 6 (six) hours as needed. 09/25/18   Mesner, Barbara Cower, MD  albuterol (VENTOLIN HFA) 108 (90 Base) MCG/ACT inhaler Inhale into the  lungs. 09/24/19   [provider]  cetirizine (ZYRTEC) 10 MG tablet Take 10 mg by mouth daily as needed. 11/04/19   [provider]  Dexmethylphenidate HCl (FOCALIN XR) 30 MG CP24 Take 2 capsules every morning 09/25/17   Elveria Rising, NP  DULoxetine (CYMBALTA) 30 MG capsule TAKE 1 CAPSULE BY MOUTH ONCE DAILY 07/15/19   [provider]  esomeprazole (NEXIUM) 20 MG capsule Take by mouth.    [provider]  etonogestrel (NEXPLANON) 68 MG IMPL implant 1 each by Subdermal route once.    [provider]  ibuprofen (ADVIL) 400 MG tablet Take 1 tablet (400 mg total) by mouth as needed. 04/29/19   Elveria Rising, NP  lactulose (CHRONULAC) 10 GM/15ML solution Take by mouth 3 (three) times daily.    [provider]  loratadine (CLARITIN) 10 MG tablet Take 10 mg by mouth daily.    [provider]  magnesium hydroxide (MILK OF MAGNESIA) 400 MG/5ML suspension Take by mouth. 09/05/17   [provider]  ondansetron (ZOFRAN-ODT) 4 MG disintegrating tablet DISSOLVE 1 TABLET IN MOUTH AT ONSET OF NAUSEA, MAY REPEAT 1 TABLET IN 6 HOURS AS NEEDED 07/16/20   Elveria Rising, NP  promethazine (PHENERGAN) 25 MG tablet TAKE 1 TABLET BY MOUTH EVERY 6 HOURS AS NEEDED FOR NAUSEA AND VOMITING . APPOINTMENT REQUIRED FOR FUTURE REFILLS 10/19/20   Elveria Rising, NP  tiZANidine (ZANAFLEX) 4 MG tablet TAKE 1 TABLET BY MOUTH AT BEDTIME WHEN HEADACHE IS SEVERE 07/16/20   Elveria Rising, NP  triamcinolone cream (KENALOG) 0.1 % Apply 1 application topically 2 (two) times daily. Do not use on face for more than 5 days 02/26/21   Renne Crigler, PA-C    Allergies    Morphine and related, Compazine [prochlorperazine], Decadron [dexamethasone], and Metoclopramide  Review of Systems   Review of Systems  All other systems reviewed and are negative.  Physical Exam Updated Vital Signs BP (!) 133/93 (BP Location: Right Arm)   Pulse 85   Temp 98.1 F (36.7 C) (Oral)   Resp (!) 24   Ht 5\' 1"  (1.549 m)   Wt 61.7 kg   LMP 03/06/2021 (Approximate)   SpO2 100%   BMI 25.70 kg/m   Physical Exam Vitals and nursing note reviewed.  Constitutional:      General: She is not in acute distress.    Appearance: Normal appearance. She is normal weight.  HENT:     Nose: Nose normal.  Eyes:     Extraocular Movements: Extraocular movements intact.     Pupils: Pupils are equal, round, and reactive to light.  Cardiovascular:     Rate and Rhythm: Normal rate.     Pulses: Normal pulses.  Pulmonary:     Effort: Pulmonary effort is normal.  Musculoskeletal:        General: Tenderness present.     Right shoulder: Normal.     Left shoulder: Tenderness and bony tenderness present. Decreased range of motion.      Left elbow: Decreased range of motion. Tenderness present in medial epicondyle, lateral epicondyle and olecranon process.     Left wrist: Tenderness and bony tenderness present. No swelling or snuff box tenderness. Normal pulse.       Arms:     Comments: 2+ left radial pulse  Skin:    General: Skin is warm.  Neurological:     General: No focal deficit present.     Mental Status: She is alert and oriented to person,  place, and time. Mental status is at baseline.  Psychiatric:        Mood and Affect: Mood normal.        Behavior: Behavior normal.    ED Results / Procedures / Treatments   Labs (all labs ordered are listed, but only abnormal results are displayed) Labs Reviewed - No data to display  EKG None  Radiology DG Elbow Complete Left  Result Date: 04/04/2021 CLINICAL DATA:  Fall getting out of the pool today. Left shoulder, left elbow, and left wrist soreness. Stiffness. EXAM: LEFT ELBOW - COMPLETE 3+ VIEW COMPARISON:  None. FINDINGS: There is no evidence of fracture, dislocation, or joint effusion. Exostosis arising from the distal humerus. No malignant changes. There is no evidence of arthropathy or other focal bone abnormality. Soft tissues are unremarkable. IMPRESSION: No acute bony abnormalities. Benign-appearing exostosis arising from the distal humerus. Electronically Signed   By: Burman Nieves M.D.   On: 04/04/2021 19:15   DG Wrist Complete Left  Result Date: 04/04/2021 CLINICAL DATA:  Fall getting out of the pool today. Left shoulder, left elbow, and left wrist soreness. Stiffness. EXAM: LEFT WRIST - COMPLETE 3+ VIEW COMPARISON:  10/26/2019 FINDINGS: There is no evidence of fracture or dislocation. There is no evidence of arthropathy or other focal bone abnormality. Soft tissues are unremarkable. Old ununited ossicle at the ulnar styloid process. IMPRESSION: Negative. Electronically Signed   By: Burman Nieves M.D.   On: 04/04/2021 19:14   DG Shoulder  Left  Result Date: 04/04/2021 CLINICAL DATA:  Fall getting out of the pool today. Left shoulder, left elbow, and left wrist soreness. Stiffness. EXAM: LEFT SHOULDER - 2+ VIEW COMPARISON:  None. FINDINGS: There is no evidence of fracture or dislocation. There is no evidence of arthropathy or other focal bone abnormality. Soft tissues are unremarkable. IMPRESSION: Negative. Electronically Signed   By: Burman Nieves M.D.   On: 04/04/2021 19:14    Procedures Procedures   Medications Ordered in ED Medications  ibuprofen (ADVIL) tablet 400 mg (has no administration in time range)  acetaminophen (TYLENOL) tablet 1,000 mg (has no administration in time range)    ED Course  I have reviewed the triage vital signs and the nursing notes.  Pertinent labs & imaging results that were available during my care of the patient were reviewed by me and considered in my medical decision making (see chart for details).    MDM Rules/Calculators/A&P                          Patient presenting today with after fall and pain to the left arm.  No other injury.  Neurovascularly intact at this time.  Patient given oral pain control with Tylenol and ibuprofen.  Plain films are pending.  7:27 PM Pt imaging is neg for acute fracture.  Pt placed in a sling and have use nsaids and tylenol.  MDM   Amount and/or Complexity of Data Reviewed Tests in the radiology section of CPT: ordered and reviewed Independent visualization of images, tracings, or specimens: yes     Final Clinical Impression(s) / ED Diagnoses Final diagnoses:  Fall, initial encounter  Contusion of left elbow, initial encounter  Sprain of left shoulder, unspecified shoulder sprain type, initial encounter  Left wrist sprain, initial encounter    Rx / DC Orders ED Discharge Orders     None        Gwyneth Sprout, MD 04/04/21 1934

## 2021-04-04 NOTE — ED Triage Notes (Addendum)
Pt arrives pov with mother, c/o L arm pain after fall when getting out of swimming pool pta. Pt c/o Lshoulder, elbow, and wrist pain. Pt also c/o fingers tingling

## 2021-04-04 NOTE — Discharge Instructions (Addendum)
Use the sling as needed but by tomorrow start stretching it out

## 2021-04-07 ENCOUNTER — Ambulatory Visit: Payer: Self-pay | Admitting: Family Medicine

## 2021-04-07 NOTE — Progress Notes (Deleted)
  Amanda Golden - 20 y.o. female MRN [id number]  Date of birth: 2001/05/10  SUBJECTIVE:  Including CC & ROS.  No chief complaint on file.   Amanda Golden is a 21 y.o. female that is  ***.    Review of Systems See HPI   HISTORY: Past Medical, Surgical, Social, and Family History Reviewed & Updated per EMR.   Pertinent Historical Findings include:  Past Medical History:  Diagnosis Date   ADHD (attention deficit hyperactivity disorder)    Anorexia nervosa with bulimia    Anxiety    Depression    DUB (dysfunctional uterine bleeding)    Eating disorder    Hypoglycemia    Migraines    Ovarian cyst     Past Surgical History:  Procedure Laterality Date   INTRAUTERINE DEVICE INSERTION     TYMPANOSTOMY TUBE PLACEMENT      Family History  Problem Relation Age of Onset   Migraines Mother    ADD / ADHD Mother    Migraines Father    Bipolar disorder Father    ADD / ADHD Father    Migraines Maternal Grandmother    Bipolar disorder Maternal Grandmother    ADD / ADHD Maternal Grandmother    Migraines Maternal Grandfather    Migraines Paternal Grandmother    ADD / ADHD Paternal Grandmother     Social History   Socioeconomic History   Marital status: Single    Spouse name: Not on file   Number of children: Not on file   Years of education: Not on file   Highest education level: Not on file  Occupational History   Not on file  Tobacco Use   Smoking status: Every Day    Pack years: 0.00    Types: Cigarettes   Smokeless tobacco: Never  Vaping Use   Vaping Use: Every day  Substance and Sexual Activity   Alcohol use: Not Currently   Drug use: Never   Sexual activity: Not on file  Other Topics Concern   Not on file  Social History Narrative   School Name: Graduate [employer]/ will be attending [employer].   How does patient do in school: average   Patient lives with: mother, adopted sister   Does patient have and IEP/504 Plan in school? Yes   If so, is the  patient meeting goals? No   Does patient receive therapies? No   If yes, what kind and how often? none   What are the patient's hobbies or interest? Ride 4 wheeler, otherwise states doesn't like to do anything but stay in her room   Social Determinants of Health   Financial Resource Strain: Not on file  Food Insecurity: Not on file  Transportation Needs: Not on file  Physical Activity: Not on file  Stress: Not on file  Social Connections: Not on file  Intimate Partner Violence: Not on file     PHYSICAL EXAM:  VS: There were no vitals taken for this visit. Physical Exam Gen: NAD, alert, cooperative with exam, well-appearing MSK:      ASSESSMENT & PLAN:   No problem-specific Assessment & Plan notes found for this encounter.

## 2021-04-13 ENCOUNTER — Encounter (HOSPITAL_BASED_OUTPATIENT_CLINIC_OR_DEPARTMENT_OTHER): Payer: Self-pay

## 2021-04-13 ENCOUNTER — Emergency Department (HOSPITAL_BASED_OUTPATIENT_CLINIC_OR_DEPARTMENT_OTHER)
Admission: EM | Admit: 2021-04-13 | Discharge: 2021-04-13 | Disposition: A | Discharge status: Home / Self Care | Payer: Medicaid Other | Attending: Emergency Medicine | Admitting: Emergency Medicine

## 2021-04-13 ENCOUNTER — Other Ambulatory Visit: Payer: Self-pay

## 2021-04-13 DIAGNOSIS — G43909 Migraine, unspecified, not intractable, without status migrainosus: Secondary | ICD-10-CM | POA: Insufficient documentation

## 2021-04-13 DIAGNOSIS — Z5321 Procedure and treatment not carried out due to patient leaving prior to being seen by health care provider: Secondary | ICD-10-CM | POA: Diagnosis not present

## 2021-04-13 NOTE — ED Triage Notes (Signed)
Pt c/o migraine x 3 days-NAD-to triage in w/c-NAD

## 2021-06-18 ENCOUNTER — Emergency Department (HOSPITAL_BASED_OUTPATIENT_CLINIC_OR_DEPARTMENT_OTHER)
Admission: EM | Admit: 2021-06-18 | Discharge: 2021-06-18 | Disposition: A | Discharge status: Home / Self Care | Payer: Medicaid Other | Attending: Emergency Medicine | Admitting: Emergency Medicine

## 2021-06-18 ENCOUNTER — Encounter (HOSPITAL_BASED_OUTPATIENT_CLINIC_OR_DEPARTMENT_OTHER): Payer: Self-pay

## 2021-06-18 ENCOUNTER — Other Ambulatory Visit: Payer: Self-pay

## 2021-06-18 DIAGNOSIS — R109 Unspecified abdominal pain: Secondary | ICD-10-CM | POA: Insufficient documentation

## 2021-06-18 DIAGNOSIS — Z5321 Procedure and treatment not carried out due to patient leaving prior to being seen by health care provider: Secondary | ICD-10-CM | POA: Diagnosis not present

## 2021-06-18 DIAGNOSIS — N898 Other specified noninflammatory disorders of vagina: Secondary | ICD-10-CM | POA: Insufficient documentation

## 2021-06-18 LAB — COMPREHENSIVE METABOLIC PANEL
ALT: 18 U/L (ref 0–44)
AST: 20 U/L (ref 15–41)
Albumin: 4.8 g/dL (ref 3.5–5.0)
Alkaline Phosphatase: 43 U/L (ref 38–126)
Anion gap: 7 (ref 5–15)
BUN: 12 mg/dL (ref 6–20)
CO2: 23 mmol/L (ref 22–32)
Calcium: 9.5 mg/dL (ref 8.9–10.3)
Chloride: 108 mmol/L (ref 98–111)
Creatinine, Ser: 0.71 mg/dL (ref 0.44–1.00)
GFR, Estimated: >60 mL/min (ref >60)
Glucose, Bld: 89 mg/dL (ref 70–99)
Potassium: 3.9 mmol/L (ref 3.5–5.1)
Sodium: 138 mmol/L (ref 135–145)
Total Bilirubin: 1.4 mg/dL — ABNORMAL HIGH (ref 0.3–1.2)
Total Protein: 7.6 g/dL (ref 6.5–8.1)

## 2021-06-18 LAB — CBC
HCT: 38.2 % (ref 36.0–46.0)
Hemoglobin: 12.8 g/dL (ref 12.0–15.0)
MCH: 28.8 pg (ref 26.0–34.0)
MCHC: 33.5 g/dL (ref 30.0–36.0)
MCV: 86 fL (ref 80.0–100.0)
Platelets: 251 10*3/uL (ref 150–400)
RBC: 4.44 MIL/uL (ref 3.87–5.11)
RDW: 12.8 % (ref 11.5–15.5)
WBC: 7 10*3/uL (ref 4.0–10.5)
nRBC: 0 % (ref 0.0–0.2)

## 2021-06-18 LAB — LIPASE, BLOOD: Lipase: 28 U/L (ref 11–51)

## 2021-06-18 NOTE — ED Triage Notes (Signed)
Pt c/o right side abd pain x today-vaginal d/c started last night-NAD-steady gait

## 2021-09-28 ENCOUNTER — Other Ambulatory Visit: Payer: Self-pay

## 2021-09-28 ENCOUNTER — Encounter (HOSPITAL_BASED_OUTPATIENT_CLINIC_OR_DEPARTMENT_OTHER): Payer: Self-pay

## 2021-09-28 ENCOUNTER — Emergency Department (HOSPITAL_BASED_OUTPATIENT_CLINIC_OR_DEPARTMENT_OTHER): Payer: Medicaid Other

## 2021-09-28 ENCOUNTER — Emergency Department (HOSPITAL_BASED_OUTPATIENT_CLINIC_OR_DEPARTMENT_OTHER)
Admission: EM | Admit: 2021-09-28 | Discharge: 2021-09-28 | Disposition: A | Discharge status: Home / Self Care | Payer: Medicaid Other | Attending: Emergency Medicine | Admitting: Emergency Medicine

## 2021-09-28 DIAGNOSIS — R5383 Other fatigue: Secondary | ICD-10-CM | POA: Insufficient documentation

## 2021-09-28 DIAGNOSIS — R059 Cough, unspecified: Secondary | ICD-10-CM | POA: Insufficient documentation

## 2021-09-28 DIAGNOSIS — R112 Nausea with vomiting, unspecified: Secondary | ICD-10-CM | POA: Insufficient documentation

## 2021-09-28 DIAGNOSIS — Z87891 Personal history of nicotine dependence: Secondary | ICD-10-CM | POA: Diagnosis not present

## 2021-09-28 DIAGNOSIS — Z20822 Contact with and (suspected) exposure to covid-19: Secondary | ICD-10-CM | POA: Diagnosis not present

## 2021-09-28 DIAGNOSIS — R0981 Nasal congestion: Secondary | ICD-10-CM | POA: Diagnosis not present

## 2021-09-28 DIAGNOSIS — J209 Acute bronchitis, unspecified: Secondary | ICD-10-CM

## 2021-09-28 LAB — URINALYSIS, ROUTINE W REFLEX MICROSCOPIC
Bilirubin Urine: NEGATIVE
Glucose, UA: NEGATIVE mg/dL
Hgb urine dipstick: NEGATIVE
Ketones, ur: NEGATIVE mg/dL
Nitrite: NEGATIVE
Protein, ur: NEGATIVE mg/dL
Specific Gravity, Urine: 1.02 (ref 1.005–1.030)
pH: 7 (ref 5.0–8.0)

## 2021-09-28 LAB — RESP PANEL BY RT-PCR (FLU A&B, COVID) ARPGX2
Influenza A by PCR: NEGATIVE
Influenza B by PCR: NEGATIVE
SARS Coronavirus 2 by RT PCR: NEGATIVE

## 2021-09-28 LAB — URINALYSIS, MICROSCOPIC (REFLEX)

## 2021-09-28 LAB — PREGNANCY, URINE: Preg Test, Ur: NEGATIVE

## 2021-09-28 MED ORDER — ALBUTEROL SULFATE HFA 108 (90 BASE) MCG/ACT IN AERS
2.0000 | INHALATION_SPRAY | Freq: Once | RESPIRATORY_TRACT | Status: AC
  Administered 2021-09-28: 2 via RESPIRATORY_TRACT
  Filled 2021-09-28: qty 6.7

## 2021-09-28 MED ORDER — METHYLPREDNISOLONE 4 MG PO TBPK: ORAL_TABLET | ORAL | 0 refills | Status: AC

## 2021-09-28 NOTE — ED Provider Notes (Signed)
[facility] EMERGENCY DEPARTMENT Provider Note   CSN: [id number] Arrival date & time: 09/28/21  2100     History Chief Complaint  Patient presents with   Cough    Amanda Golden is a 20 y.o. female.  HPI  20 year old female with past medical history of anxiety, depression, eating disorder, migraines presents the emergency department with cough and flulike symptoms.  Patient states has been going on for the past 4 days.  She is had an intermittently productive cough of clear sputum.  She has felt feverish at home with no documented fever.  Endorses nasal congestion, thick green snot, chills, fatigue, nausea with 1 episode of vomiting.  No active chest pain or back pain.  No lower extremity swelling.  No history of DVT/PE, not currently on any hormonal therapy.  Past Medical History:  Diagnosis Date   ADHD (attention deficit hyperactivity disorder)    Anorexia nervosa with bulimia    Anxiety    Depression    DUB (dysfunctional uterine bleeding)    Eating disorder    Hypoglycemia    Migraines    Ovarian cyst     Patient Active Problem List   Diagnosis Date Noted   History of suicidal ideation 09/30/2017   Depression 09/25/2017   Migraine without aura and without status migrainosus, not intractable 07/25/2017   Tension type headache 07/25/2017   History of cholesteatoma left ear 07/25/2017   Disrupted sleep-wake cycle 07/25/2017   Anxiety 07/25/2017   Eating disorder 07/25/2017   ADHD (attention deficit hyperactivity disorder) 07/25/2017   Problems with learning 07/25/2017    Past Surgical History:  Procedure Laterality Date   INTRAUTERINE DEVICE INSERTION     TYMPANOSTOMY TUBE PLACEMENT       OB History   No obstetric history on file.     Family History  Problem Relation Age of Onset   Migraines Mother    ADD / ADHD Mother    Migraines Father    Bipolar disorder Father    ADD / ADHD Father    Migraines Maternal Grandmother    Bipolar disorder  Maternal Grandmother    ADD / ADHD Maternal Grandmother    Migraines Maternal Grandfather    Migraines Paternal Grandmother    ADD / ADHD Paternal Grandmother     Social History   Tobacco Use   Smoking status: Former    Types: Cigarettes   Smokeless tobacco: Never  Vaping Use   Vaping Use: Every day  Substance Use Topics   Alcohol use: Not Currently   Drug use: Never    Home Medications Prior to Admission medications   Medication Sig Start Date End Date Taking? Authorizing Provider  acetaminophen (TYLENOL) 325 MG tablet Take 2 tablets (650 mg total) by mouth every 6 (six) hours as needed. 09/25/18   Mesner, Barbara Cower, MD  albuterol (VENTOLIN HFA) 108 (90 Base) MCG/ACT inhaler Inhale into the lungs. 09/24/19   [provider]  cetirizine (ZYRTEC) 10 MG tablet Take 10 mg by mouth daily as needed. 11/04/19   [provider]  Dexmethylphenidate HCl (FOCALIN XR) 30 MG CP24 Take 2 capsules every morning 09/25/17   Elveria Rising, NP  DULoxetine (CYMBALTA) 30 MG capsule TAKE 1 CAPSULE BY MOUTH ONCE DAILY 07/15/19   [provider]  esomeprazole (NEXIUM) 20 MG capsule Take by mouth.    [provider]  etonogestrel (NEXPLANON) 68 MG IMPL implant 1 each by Subdermal route once.    [provider]  ibuprofen (  ADVIL) 400 MG tablet Take 1 tablet (400 mg total) by mouth as needed. 04/29/19   Rockwell Germany, NP  lactulose (CHRONULAC) 10 GM/15ML solution Take by mouth 3 (three) times daily.    [provider]  loratadine (CLARITIN) 10 MG tablet Take 10 mg by mouth daily.    [provider]  magnesium hydroxide (MILK OF MAGNESIA) 400 MG/5ML suspension Take by mouth. 09/05/17   [provider]  ondansetron (ZOFRAN-ODT) 4 MG disintegrating tablet DISSOLVE 1 TABLET IN MOUTH AT ONSET OF NAUSEA, MAY REPEAT 1 TABLET IN 6 HOURS AS NEEDED 07/16/20   Rockwell Germany, NP  promethazine (PHENERGAN) 25 MG tablet TAKE 1 TABLET BY MOUTH EVERY 6  HOURS AS NEEDED FOR NAUSEA AND VOMITING . APPOINTMENT REQUIRED FOR FUTURE REFILLS 10/19/20   Rockwell Germany, NP  tiZANidine (ZANAFLEX) 4 MG tablet TAKE 1 TABLET BY MOUTH AT BEDTIME WHEN HEADACHE IS SEVERE 07/16/20   Rockwell Germany, NP  triamcinolone cream (KENALOG) 0.1 % Apply 1 application topically 2 (two) times daily. Do not use on face for more than 5 days 02/26/21   Carlisle Cater, PA-C    Allergies    Morphine and related, Compazine [prochlorperazine], and Metoclopramide  Review of Systems   Review of Systems  Constitutional:  Positive for appetite change, chills and fatigue. Negative for fever.  HENT:  Positive for congestion, postnasal drip, rhinorrhea and sore throat. Negative for ear pain and trouble swallowing.   Eyes:  Negative for visual disturbance.  Respiratory:  Positive for cough. Negative for shortness of breath.   Cardiovascular:  Negative for chest pain, palpitations and leg swelling.  Gastrointestinal:  Negative for abdominal pain, diarrhea and vomiting.  Genitourinary:  Negative for dysuria.  Musculoskeletal:  Negative for back pain.  Skin:  Negative for rash.  Neurological:  Negative for headaches.   Physical Exam Updated Vital Signs BP 116/63 (BP Location: Right Arm)   Pulse 94   Temp 98.7 F (37.1 C) (Oral)   Resp 16   Ht 5\' 1"  (1.549 m)   Wt 64.4 kg   LMP 09/22/2021   SpO2 100%   BMI 26.83 kg/m   Physical Exam Vitals and nursing note reviewed.  Constitutional:      General: She is not in acute distress.    Appearance: Normal appearance. She is not diaphoretic.  HENT:     Head: Normocephalic.     Nose: Congestion present.     Mouth/Throat:     Mouth: Mucous membranes are moist.  Cardiovascular:     Rate and Rhythm: Normal rate.  Pulmonary:     Effort: Pulmonary effort is normal. No respiratory distress.     Breath sounds: Wheezing present.  Abdominal:     Palpations: Abdomen is soft.     Tenderness: There is no abdominal tenderness.   Musculoskeletal:        General: No swelling or deformity.  Skin:    General: Skin is warm.  Neurological:     Mental Status: She is alert and oriented to person, place, and time. Mental status is at baseline.  Psychiatric:        Mood and Affect: Mood normal.    ED Results / Procedures / Treatments   Labs (all labs ordered are listed, but only abnormal results are displayed) Labs Reviewed  RESP PANEL BY RT-PCR (FLU A&B, COVID) ARPGX2  PREGNANCY, URINE    EKG None  Radiology No results found.  Procedures Procedures   Medications Ordered in ED Medications  albuterol (VENTOLIN HFA) 108 (90 Base) MCG/ACT inhaler 2 puff (has no administration in time range)    ED Course  I have reviewed the triage vital signs and the nursing notes.  Pertinent labs & imaging results that were available during my care of the patient were reviewed by me and considered in my medical decision making (see chart for details).    MDM Rules/Calculators/A&P                           20 year old female presents emergency department with cough and viral-like syndrome for the past 4 days.  Afebrile on arrival, normal vitals.  No PE like symptoms, she is PERC negative.  Scattered wheezing on lung exam.  No distress.  Flu and COVID are negative.  Chest x-ray shows no acute pneumonia.  I believe patient is suffering from a viral bronchitis.  She had improvement with albuterol inhaler.  Plan to treat symptomatically with albuterol and Medrol Dosepak.  Educated the patient on expected course and symptomatic treatment.  Patient at this time appears safe and stable for discharge and will be treated as an outpatient.  Discharge plan and strict return to ED precautions discussed, patient verbalizes understanding and agreement.  Final Clinical Impression(s) / ED Diagnoses Final diagnoses:  None    Rx / DC Orders ED Discharge Orders     None        Lorelle Gibbs, DO 09/28/21 2302

## 2021-09-28 NOTE — Discharge Instructions (Signed)
You have been seen and discharged from the emergency department.  Your flu and COVID swab are negative.  I believe you are suffering from viral bronchitis.  Use inhaler as directed, take Medrol Dosepak as prescribed.  Use Tylenol/ibuprofen as needed.  Stay well-hydrated.  Follow-up with your primary provider for reevaluation and further care. Take home medications as prescribed. If you have any worsening symptoms or further concerns for your health please return to an emergency department for further evaluation.

## 2021-09-28 NOTE — ED Notes (Signed)
Patient discharged to home.  All discharge instructions reviewed.  Patient verbalized understanding via teachback method.  VS WDL.  Respirations even and unlabored.  Ambulatory out of ED.   °

## 2021-09-28 NOTE — ED Triage Notes (Signed)
Pt c/o flu like sx x 4 days-NAD-steady gait 

## 2021-10-19 ENCOUNTER — Encounter (HOSPITAL_BASED_OUTPATIENT_CLINIC_OR_DEPARTMENT_OTHER): Payer: Self-pay | Admitting: *Deleted

## 2021-10-19 ENCOUNTER — Other Ambulatory Visit: Payer: Self-pay

## 2021-10-19 ENCOUNTER — Emergency Department (HOSPITAL_BASED_OUTPATIENT_CLINIC_OR_DEPARTMENT_OTHER): Payer: Medicaid Other

## 2021-10-19 ENCOUNTER — Emergency Department (HOSPITAL_BASED_OUTPATIENT_CLINIC_OR_DEPARTMENT_OTHER)
Admission: EM | Admit: 2021-10-19 | Discharge: 2021-10-19 | Disposition: A | Discharge status: Home / Self Care | Payer: Medicaid Other | Attending: Student | Admitting: Student

## 2021-10-19 DIAGNOSIS — K5900 Constipation, unspecified: Secondary | ICD-10-CM | POA: Insufficient documentation

## 2021-10-19 DIAGNOSIS — G43009 Migraine without aura, not intractable, without status migrainosus: Secondary | ICD-10-CM

## 2021-10-19 DIAGNOSIS — Z87891 Personal history of nicotine dependence: Secondary | ICD-10-CM | POA: Insufficient documentation

## 2021-10-19 DIAGNOSIS — N390 Urinary tract infection, site not specified: Secondary | ICD-10-CM | POA: Insufficient documentation

## 2021-10-19 DIAGNOSIS — K5790 Diverticulosis of intestine, part unspecified, without perforation or abscess without bleeding: Secondary | ICD-10-CM | POA: Insufficient documentation

## 2021-10-19 DIAGNOSIS — K529 Noninfective gastroenteritis and colitis, unspecified: Secondary | ICD-10-CM | POA: Diagnosis not present

## 2021-10-19 DIAGNOSIS — R1032 Left lower quadrant pain: Secondary | ICD-10-CM | POA: Diagnosis present

## 2021-10-19 LAB — URINALYSIS, MICROSCOPIC (REFLEX)

## 2021-10-19 LAB — COMPREHENSIVE METABOLIC PANEL
ALT: 32 U/L (ref 0–44)
AST: 27 U/L (ref 15–41)
Albumin: 4.1 g/dL (ref 3.5–5.0)
Alkaline Phosphatase: 46 U/L (ref 38–126)
Anion gap: 6 (ref 5–15)
BUN: 15 mg/dL (ref 6–20)
CO2: 27 mmol/L (ref 22–32)
Calcium: 9 mg/dL (ref 8.9–10.3)
Chloride: 104 mmol/L (ref 98–111)
Creatinine, Ser: 0.74 mg/dL (ref 0.44–1.00)
GFR, Estimated: >60 mL/min (ref >60)
Glucose, Bld: 85 mg/dL (ref 70–99)
Potassium: 4.2 mmol/L (ref 3.5–5.1)
Sodium: 137 mmol/L (ref 135–145)
Total Bilirubin: 0.6 mg/dL (ref 0.3–1.2)
Total Protein: 7 g/dL (ref 6.5–8.1)

## 2021-10-19 LAB — LIPASE, BLOOD: Lipase: 35 U/L (ref 11–51)

## 2021-10-19 LAB — CBC WITH DIFFERENTIAL/PLATELET
Abs Immature Granulocytes: 0.01 10*3/uL (ref 0.00–0.07)
Basophils Absolute: 0.1 10*3/uL (ref 0.0–0.1)
Basophils Relative: 1 %
Eosinophils Absolute: 0.2 10*3/uL (ref 0.0–0.5)
Eosinophils Relative: 4 %
HCT: 37.5 % (ref 36.0–46.0)
Hemoglobin: 12.2 g/dL (ref 12.0–15.0)
Immature Granulocytes: 0 %
Lymphocytes Relative: 26 %
Lymphs Abs: 1.4 10*3/uL (ref 0.7–4.0)
MCH: 28 pg (ref 26.0–34.0)
MCHC: 32.5 g/dL (ref 30.0–36.0)
MCV: 86 fL (ref 80.0–100.0)
Monocytes Absolute: 0.6 10*3/uL (ref 0.1–1.0)
Monocytes Relative: 11 %
Neutro Abs: 3.2 10*3/uL (ref 1.7–7.7)
Neutrophils Relative %: 58 %
Platelets: 241 10*3/uL (ref 150–400)
RBC: 4.36 MIL/uL (ref 3.87–5.11)
RDW: 13.6 % (ref 11.5–15.5)
WBC: 5.4 10*3/uL (ref 4.0–10.5)
nRBC: 0 % (ref 0.0–0.2)

## 2021-10-19 LAB — URINALYSIS, ROUTINE W REFLEX MICROSCOPIC
Bilirubin Urine: NEGATIVE
Glucose, UA: NEGATIVE mg/dL
Ketones, ur: NEGATIVE mg/dL
Nitrite: NEGATIVE
Protein, ur: NEGATIVE mg/dL
Specific Gravity, Urine: 1.025 (ref 1.005–1.030)
pH: 7 (ref 5.0–8.0)

## 2021-10-19 LAB — PREGNANCY, URINE: Preg Test, Ur: NEGATIVE

## 2021-10-19 MED ORDER — PANTOPRAZOLE SODIUM 20 MG PO TBEC: 20.0000 mg | DELAYED_RELEASE_TABLET | Freq: Every day | ORAL | 0 refills | Status: AC

## 2021-10-19 MED ORDER — SODIUM CHLORIDE 0.9 % IV BOLUS
1000.0000 mL | Freq: Once | INTRAVENOUS | Status: AC
  Administered 2021-10-19: 21:00:00 1000 mL via INTRAVENOUS

## 2021-10-19 MED ORDER — ONDANSETRON HCL 4 MG/2ML IJ SOLN
4.0000 mg | Freq: Once | INTRAMUSCULAR | Status: AC
  Administered 2021-10-19: 20:00:00 4 mg via INTRAVENOUS
  Filled 2021-10-19: qty 2

## 2021-10-19 MED ORDER — IOHEXOL 300 MG/ML  SOLN
100.0000 mL | Freq: Once | INTRAMUSCULAR | Status: AC | PRN
  Administered 2021-10-19: 21:00:00 100 mL via INTRAVENOUS

## 2021-10-19 MED ORDER — ONDANSETRON HCL 4 MG PO TABS: 4.0000 mg | ORAL_TABLET | Freq: Four times a day (QID) | ORAL | 0 refills | Status: AC

## 2021-10-19 MED ORDER — CEPHALEXIN 500 MG PO CAPS: 500.0000 mg | ORAL_CAPSULE | Freq: Four times a day (QID) | ORAL | 0 refills | Status: AC

## 2021-10-19 MED ORDER — KETOROLAC TROMETHAMINE 15 MG/ML IJ SOLN
15.0000 mg | Freq: Once | INTRAMUSCULAR | Status: AC
  Administered 2021-10-19: 21:00:00 15 mg via INTRAVENOUS
  Filled 2021-10-19: qty 1

## 2021-10-19 NOTE — ED Notes (Signed)
Patient taken to CT at this time.

## 2021-10-19 NOTE — Discharge Instructions (Addendum)
You do have urinary tract infection today.  Antibiotics have been sent to your pharmacy.  Your CT scan shows evidence of constipation, diverticulosis and bowel inflammation.  These are reasons to follow-up with gastroenterology.  You may treat your constipation with MiraLAX and I have refilled your Zofran.  I have also sent pantoprazole to the pharmacy.  This helps with abdominal discomfort and burning.  Take this every day, in the morning and on an empty stomach.

## 2021-10-19 NOTE — ED Notes (Signed)
Patient returned from CT.  Reports no improvement in pain at this time

## 2021-10-19 NOTE — ED Triage Notes (Signed)
LMP today. LLQ pain. Vomiting.

## 2021-10-19 NOTE — ED Provider Notes (Signed)
[facility] Provider Note   CSN: [id number] Arrival date & time: 10/19/21  1857     History Chief Complaint  Patient presents with   Abdominal Pain    Deannie Vario is a 20 y.o. female with a past medical history of disordered eating, anxiety and depression presenting today with complaint of, burning in her left lower quadrant.  Reports has been going on for 2 days.  Thought that she may have a UTI or yeast infection so she bought Azo yesterday.  This has not helped.  Currently on menstrual period and is unable to state whether or not there is blood in her urine.  Reports a decrease in her appetite however she eats enough just to make sure that her blood sugar does not drop.  No abnormal vaginal discharge or odor.  No fevers or chills.  No history of abdominal surgery.  Primary care treats her for abdominal discomfort with Zofran and Phenergan.  Phenergan was recently stopped.    Past Medical History:  Diagnosis Date   ADHD (attention deficit hyperactivity disorder)    Anorexia nervosa with bulimia    Anxiety    Depression    DUB (dysfunctional uterine bleeding)    Eating disorder    Hypoglycemia    Migraines    Ovarian cyst     Patient Active Problem List   Diagnosis Date Noted   History of suicidal ideation 09/30/2017   Depression 09/25/2017   Migraine without aura and without status migrainosus, not intractable 07/25/2017   Tension type headache 07/25/2017   History of cholesteatoma left ear 07/25/2017   Disrupted sleep-wake cycle 07/25/2017   Anxiety 07/25/2017   Eating disorder 07/25/2017   ADHD (attention deficit hyperactivity disorder) 07/25/2017   Problems with learning 07/25/2017    Past Surgical History:  Procedure Laterality Date   INTRAUTERINE DEVICE INSERTION     TYMPANOSTOMY TUBE PLACEMENT       OB History   No obstetric history on file.     Family History  Problem Relation Age of Onset   Migraines Mother    ADD  / ADHD Mother    Migraines Father    Bipolar disorder Father    ADD / ADHD Father    Migraines Maternal Grandmother    Bipolar disorder Maternal Grandmother    ADD / ADHD Maternal Grandmother    Migraines Maternal Grandfather    Migraines Paternal Grandmother    ADD / ADHD Paternal Grandmother     Social History   Tobacco Use   Smoking status: Former    Types: Cigarettes   Smokeless tobacco: Never  Vaping Use   Vaping Use: Every day  Substance Use Topics   Alcohol use: Not Currently   Drug use: Never    Home Medications Prior to Admission medications   Medication Sig Start Date End Date Taking? Authorizing Provider  albuterol (VENTOLIN HFA) 108 (90 Base) MCG/ACT inhaler Inhale into the lungs. 09/24/19  Yes [provider]  buPROPion (WELLBUTRIN) 100 MG tablet Take 100 mg by mouth 2 (two) times daily. 09/21/21  Yes [provider]  clomiPHENE (CLOMID) 50 MG tablet Take by mouth. 08/26/21  Yes [provider]  gabapentin (NEURONTIN) 300 MG capsule Take 300 mg by mouth 2 (two) times daily. 09/21/21  Yes [provider]  acetaminophen (TYLENOL) 325 MG tablet Take 2 tablets (650 mg total) by mouth every 6 (six) hours as needed. 09/25/18   Mesner, Corene Cornea, MD  cetirizine Alethia Berthold)  10 MG tablet Take 10 mg by mouth daily as needed. 11/04/19   [provider]  Dexmethylphenidate HCl 30 MG CP24 Take 2 capsules every morning 09/25/17   Rockwell Germany, NP  DULoxetine (CYMBALTA) 30 MG capsule TAKE 1 CAPSULE BY MOUTH ONCE DAILY 07/15/19   [provider]  esomeprazole (NEXIUM) 20 MG capsule Take by mouth.    [provider]  etonogestrel (NEXPLANON) 68 MG IMPL implant 1 each by Subdermal route once.    [provider]  ibuprofen (ADVIL) 400 MG tablet Take 1 tablet (400 mg total) by mouth as needed. 04/29/19   Rockwell Germany, NP  lactulose (CHRONULAC) 10 GM/15ML solution Take by mouth 3 (three) times daily.    [provider]  loratadine (CLARITIN) 10 MG tablet Take 10 mg by mouth daily.    [provider]  magnesium hydroxide (MILK OF MAGNESIA) 400 MG/5ML suspension Take by mouth. 09/05/17   [provider]  methylPREDNISolone (MEDROL DOSEPAK) 4 MG TBPK tablet Take as directed 09/28/21   Horton, Kristie M, DO  ondansetron (ZOFRAN-ODT) 4 MG disintegrating tablet DISSOLVE 1 TABLET IN MOUTH AT ONSET OF NAUSEA, MAY REPEAT 1 TABLET IN 6 HOURS AS NEEDED 07/16/20   Rockwell Germany, NP  promethazine (PHENERGAN) 25 MG tablet TAKE 1 TABLET BY MOUTH EVERY 6 HOURS AS NEEDED FOR NAUSEA AND VOMITING . APPOINTMENT REQUIRED FOR FUTURE REFILLS 10/19/20   Rockwell Germany, NP  tiZANidine (ZANAFLEX) 4 MG tablet TAKE 1 TABLET BY MOUTH AT BEDTIME WHEN HEADACHE IS SEVERE 07/16/20   Rockwell Germany, NP  triamcinolone cream (KENALOG) 0.1 % Apply 1 application topically 2 (two) times daily. Do not use on face for more than 5 days 02/26/21   Carlisle Cater, PA-C    Allergies    Morphine and related, Compazine [prochlorperazine], and Metoclopramide  Review of Systems   Review of Systems  Constitutional:  Negative for chills and fever.  Gastrointestinal:  Positive for abdominal pain and nausea. Negative for diarrhea and vomiting.  Genitourinary:  Positive for dysuria. Negative for hematuria and vaginal discharge.  Skin:  Negative for wound.  All other systems reviewed and are negative.  Physical Exam Updated Vital Signs BP 117/77 (BP Location: Left Arm)    Pulse 72    Temp 98.1 F (36.7 C) (Oral)    Resp 18    Ht 5\' 1"  (1.549 m)    Wt 64.4 kg    LMP 10/18/2021    SpO2 100%    BMI 26.83 kg/m   Physical Exam Vitals and nursing note reviewed.  Constitutional:      General: She is not in acute distress.    Appearance: Normal appearance. She is not ill-appearing.  HENT:     Head: Normocephalic and atraumatic.     Mouth/Throat:     Mouth: Mucous membranes are moist.     Pharynx: Oropharynx is clear. No  pharyngeal swelling or oropharyngeal exudate.  Eyes:     General: No scleral icterus.    Conjunctiva/sclera: Conjunctivae normal.  Cardiovascular:     Rate and Rhythm: Normal rate and regular rhythm.  Pulmonary:     Effort: Pulmonary effort is normal. No respiratory distress.     Breath sounds: No wheezing.  Abdominal:     Palpations: Abdomen is rigid.     Tenderness: There is abdominal tenderness in the left lower quadrant. There is no right CVA tenderness or left CVA tenderness.     Hernia: No hernia is present.  Skin:  General: Skin is warm and dry.     Findings: No rash.  Neurological:     Mental Status: She is alert.  Psychiatric:        Mood and Affect: Mood normal.    ED Results / Procedures / Treatments   Labs (all labs ordered are listed, but only abnormal results are displayed) Labs Reviewed  URINALYSIS, ROUTINE W REFLEX MICROSCOPIC - Abnormal; Notable for the following components:      Result Value   APPearance HAZY (*)    Hgb urine dipstick MODERATE (*)    Leukocytes,Ua SMALL (*)    All other components within normal limits  URINALYSIS, MICROSCOPIC (REFLEX) - Abnormal; Notable for the following components:   Bacteria, UA RARE (*)    All other components within normal limits  CBC WITH DIFFERENTIAL/PLATELET  COMPREHENSIVE METABOLIC PANEL  LIPASE, BLOOD  PREGNANCY, URINE    EKG None  Radiology CT ABDOMEN PELVIS W CONTRAST  Result Date: 10/19/2021 CLINICAL DATA:  Abdominal pain. EXAM: CT ABDOMEN AND PELVIS WITH CONTRAST TECHNIQUE: Multidetector CT imaging of the abdomen and pelvis was performed using the standard protocol following bolus administration of intravenous contrast. CONTRAST:  OMNIPAQUE IOHEXOL 300 MG/ML  SOLN COMPARISON:  CTs with IV contrast 08/13/2021, 06/19/2021.  Smith FINDINGS: Lower chest: No acute abnormality. Hepatobiliary: No focal liver abnormality is seen. No gallstones, gallbladder wall thickening, or biliary dilatation. The  superior liver dome was excluded from the exam. Pancreas: Unremarkable. No pancreatic ductal dilatation or surrounding inflammatory changes. Spleen: Normal in size without focal abnormality. Adrenals/Urinary Tract: Adrenal glands are unremarkable. Kidneys are normal, without renal calculi, focal lesion, or hydronephrosis. There is mild bladder thickening versus underdistention. Stomach/Bowel: There is fold thickening in the left abdominal small bowel, mucosal enhancement in normal caliber fluid-filled lower abdominal small bowel. Normal appendix. Mild-to-moderate constipation. Diverticula scattered along the left colon without evidence of colitis or diverticulitis. Vascular/Lymphatic: No significant vascular findings are present. No enlarged abdominal or pelvic lymph nodes. Reproductive: Uterus and bilateral adnexa are unremarkable. Other: There is trace low-density fluid in the pelvic cul-de-sac, seen on both prior studies and likely physiologic. There is no free air, hemorrhage or abscess. Musculoskeletal: No acute or significant osseous findings. IMPRESSION: 1. Imaging findings suggesting a nonspecific small bowel enteritis without obstructive or mesenteric inflammatory component or adenopathy. 2. Constipation with diverticulosis. 3. Cystitis versus bladder nondistention. Electronically Signed   By: Almira Bar M.D.   On: 10/19/2021 21:34    Procedures Procedures   Medications Ordered in ED Medications  ondansetron (ZOFRAN) injection 4 mg (4 mg Intravenous Given 10/19/21 2014)  ketorolac (TORADOL) 15 MG/ML injection 15 mg (15 mg Intravenous Given 10/19/21 2040)  sodium chloride 0.9 % bolus 1,000 mL (0 mLs Intravenous Stopped 10/19/21 2222)  iohexol (OMNIPAQUE) 300 MG/ML solution 100 mL (100 mLs Intravenous Contrast Given 10/19/21 2113)    ED Course  I have reviewed the triage vital signs and the nursing notes.  Pertinent labs & imaging results that were available during my care of the patient  were reviewed by me and considered in my medical decision making (see chart for details).    MDM Rules/Calculators/A&P 20 year old female with abdominal pain.  No vaginal symptoms and currently on menstrual periods.  No history of abdominal surgeries however reports she has had episodes of constipation and colitis.  Has not seen a GI provider in a long time.  Primary care manages her GI discomfort, and she reports she was on chronic Zofran and Phenergan  until last week when her phenergan was stopped.  Blood work without abnormalities.  Urinalysis positive for leukocytes and white blood cells.  Will treat for UTI, no concern for pyelonephritis.  CT revealing of diverticulosis, constipation and enteritis.  Patient was notified of this and we discussed proper treatment with antibiotics, Zofran and PPI.  She will follow-up with GI.  She is agreeable to this plan.  Final Clinical Impression(s) / ED Diagnoses Final diagnoses:  Diverticulosis  Enteritis    Rx / DC Orders Results and diagnoses were explained to the patient. Return precautions discussed in full. Patient had no additional questions and expressed complete understanding.     Darliss Ridgel 10/19/21 2235    Teressa Lower, MD 10/19/21 [phone]

## 2021-10-26 ENCOUNTER — Encounter (HOSPITAL_BASED_OUTPATIENT_CLINIC_OR_DEPARTMENT_OTHER): Payer: Self-pay | Admitting: Emergency Medicine

## 2021-10-26 ENCOUNTER — Other Ambulatory Visit: Payer: Self-pay

## 2021-10-26 ENCOUNTER — Emergency Department (HOSPITAL_BASED_OUTPATIENT_CLINIC_OR_DEPARTMENT_OTHER)
Admission: EM | Admit: 2021-10-26 | Discharge: 2021-10-26 | Disposition: A | Discharge status: Home / Self Care | Payer: Medicaid Other | Attending: Emergency Medicine | Admitting: Emergency Medicine

## 2021-10-26 DIAGNOSIS — R1032 Left lower quadrant pain: Secondary | ICD-10-CM | POA: Diagnosis not present

## 2021-10-26 DIAGNOSIS — Z5321 Procedure and treatment not carried out due to patient leaving prior to being seen by health care provider: Secondary | ICD-10-CM | POA: Diagnosis not present

## 2021-10-26 LAB — URINALYSIS, MICROSCOPIC (REFLEX)

## 2021-10-26 LAB — URINALYSIS, ROUTINE W REFLEX MICROSCOPIC
Bilirubin Urine: NEGATIVE
Glucose, UA: NEGATIVE mg/dL
Hgb urine dipstick: NEGATIVE
Ketones, ur: NEGATIVE mg/dL
Nitrite: NEGATIVE
Protein, ur: NEGATIVE mg/dL
Specific Gravity, Urine: >1.03 — ABNORMAL HIGH (ref 1.005–1.030)
pH: 6 (ref 5.0–8.0)

## 2021-10-26 LAB — PREGNANCY, URINE: Preg Test, Ur: NEGATIVE

## 2021-10-26 LAB — CBC
HCT: 38.9 % (ref 36.0–46.0)
Hemoglobin: 12.9 g/dL (ref 12.0–15.0)
MCH: 28.7 pg (ref 26.0–34.0)
MCHC: 33.2 g/dL (ref 30.0–36.0)
MCV: 86.4 fL (ref 80.0–100.0)
Platelets: 277 10*3/uL (ref 150–400)
RBC: 4.5 MIL/uL (ref 3.87–5.11)
RDW: 13.2 % (ref 11.5–15.5)
WBC: 8 10*3/uL (ref 4.0–10.5)
nRBC: 0 % (ref 0.0–0.2)

## 2021-10-26 LAB — COMPREHENSIVE METABOLIC PANEL
ALT: 30 U/L (ref 0–44)
AST: 23 U/L (ref 15–41)
Albumin: 4 g/dL (ref 3.5–5.0)
Alkaline Phosphatase: 50 U/L (ref 38–126)
Anion gap: 8 (ref 5–15)
BUN: 11 mg/dL (ref 6–20)
CO2: 25 mmol/L (ref 22–32)
Calcium: 8.9 mg/dL (ref 8.9–10.3)
Chloride: 105 mmol/L (ref 98–111)
Creatinine, Ser: 0.73 mg/dL (ref 0.44–1.00)
GFR, Estimated: >60 mL/min (ref >60)
Glucose, Bld: 101 mg/dL — ABNORMAL HIGH (ref 70–99)
Potassium: 4.5 mmol/L (ref 3.5–5.1)
Sodium: 138 mmol/L (ref 135–145)
Total Bilirubin: 0.5 mg/dL (ref 0.3–1.2)
Total Protein: 7.2 g/dL (ref 6.5–8.1)

## 2021-10-26 LAB — LIPASE, BLOOD: Lipase: 35 U/L (ref 11–51)

## 2021-10-26 NOTE — ED Triage Notes (Signed)
Pt c/o  continued LLQ w/ radiation to LT flank, NV; seen here on 12/27 for same

## 2021-10-27 ENCOUNTER — Other Ambulatory Visit: Payer: Self-pay

## 2021-10-27 ENCOUNTER — Emergency Department (HOSPITAL_BASED_OUTPATIENT_CLINIC_OR_DEPARTMENT_OTHER)
Admission: EM | Admit: 2021-10-27 | Discharge: 2021-10-28 | Disposition: A | Discharge status: Home / Self Care | Payer: Medicaid Other | Attending: Emergency Medicine | Admitting: Emergency Medicine

## 2021-10-27 ENCOUNTER — Emergency Department (HOSPITAL_BASED_OUTPATIENT_CLINIC_OR_DEPARTMENT_OTHER): Payer: Medicaid Other

## 2021-10-27 ENCOUNTER — Encounter (HOSPITAL_BASED_OUTPATIENT_CLINIC_OR_DEPARTMENT_OTHER): Payer: Self-pay | Admitting: *Deleted

## 2021-10-27 DIAGNOSIS — R1032 Left lower quadrant pain: Secondary | ICD-10-CM | POA: Diagnosis present

## 2021-10-27 DIAGNOSIS — K5900 Constipation, unspecified: Secondary | ICD-10-CM | POA: Insufficient documentation

## 2021-10-27 LAB — URINALYSIS, ROUTINE W REFLEX MICROSCOPIC
Bilirubin Urine: NEGATIVE
Glucose, UA: NEGATIVE mg/dL
Hgb urine dipstick: NEGATIVE
Ketones, ur: NEGATIVE mg/dL
Leukocytes,Ua: NEGATIVE
Nitrite: NEGATIVE
Protein, ur: NEGATIVE mg/dL
Specific Gravity, Urine: >=1.03 (ref 1.005–1.030)
pH: 6 (ref 5.0–8.0)

## 2021-10-27 LAB — CBC WITH DIFFERENTIAL/PLATELET
Abs Immature Granulocytes: 0.01 10*3/uL (ref 0.00–0.07)
Basophils Absolute: 0.1 10*3/uL (ref 0.0–0.1)
Basophils Relative: 1 %
Eosinophils Absolute: 0.2 10*3/uL (ref 0.0–0.5)
Eosinophils Relative: 2 %
HCT: 38.3 % (ref 36.0–46.0)
Hemoglobin: 12.4 g/dL (ref 12.0–15.0)
Immature Granulocytes: 0 %
Lymphocytes Relative: 45 %
Lymphs Abs: 3.3 10*3/uL (ref 0.7–4.0)
MCH: 27.8 pg (ref 26.0–34.0)
MCHC: 32.4 g/dL (ref 30.0–36.0)
MCV: 85.9 fL (ref 80.0–100.0)
Monocytes Absolute: 0.7 10*3/uL (ref 0.1–1.0)
Monocytes Relative: 9 %
Neutro Abs: 3.2 10*3/uL (ref 1.7–7.7)
Neutrophils Relative %: 43 %
Platelets: 254 10*3/uL (ref 150–400)
RBC: 4.46 MIL/uL (ref 3.87–5.11)
RDW: 13.1 % (ref 11.5–15.5)
WBC: 7.5 10*3/uL (ref 4.0–10.5)
nRBC: 0 % (ref 0.0–0.2)

## 2021-10-27 LAB — COMPREHENSIVE METABOLIC PANEL
ALT: 26 U/L (ref 0–44)
AST: 21 U/L (ref 15–41)
Albumin: 4 g/dL (ref 3.5–5.0)
Alkaline Phosphatase: 53 U/L (ref 38–126)
Anion gap: 10 (ref 5–15)
BUN: 9 mg/dL (ref 6–20)
CO2: 22 mmol/L (ref 22–32)
Calcium: 8.8 mg/dL — ABNORMAL LOW (ref 8.9–10.3)
Chloride: 106 mmol/L (ref 98–111)
Creatinine, Ser: 0.65 mg/dL (ref 0.44–1.00)
GFR, Estimated: >60 mL/min (ref >60)
Glucose, Bld: 92 mg/dL (ref 70–99)
Potassium: 3.9 mmol/L (ref 3.5–5.1)
Sodium: 138 mmol/L (ref 135–145)
Total Bilirubin: 0.5 mg/dL (ref 0.3–1.2)
Total Protein: 7 g/dL (ref 6.5–8.1)

## 2021-10-27 LAB — PREGNANCY, URINE: Preg Test, Ur: NEGATIVE

## 2021-10-27 MED ORDER — ONDANSETRON HCL 4 MG/2ML IJ SOLN
4.0000 mg | Freq: Once | INTRAMUSCULAR | Status: AC
  Administered 2021-10-27: 4 mg via INTRAVENOUS
  Filled 2021-10-27: qty 2

## 2021-10-27 MED ORDER — FENTANYL CITRATE PF 50 MCG/ML IJ SOSY
50.0000 ug | PREFILLED_SYRINGE | Freq: Once | INTRAMUSCULAR | Status: AC
  Administered 2021-10-28: 50 ug via INTRAVENOUS
  Filled 2021-10-27: qty 1

## 2021-10-27 MED ORDER — KETOROLAC TROMETHAMINE 15 MG/ML IJ SOLN
15.0000 mg | Freq: Once | INTRAMUSCULAR | Status: AC
  Administered 2021-10-28: 15 mg via INTRAVENOUS
  Filled 2021-10-27: qty 1

## 2021-10-27 MED ORDER — LACTATED RINGERS IV BOLUS
1000.0000 mL | Freq: Once | INTRAVENOUS | Status: AC
  Administered 2021-10-27: 1000 mL via INTRAVENOUS

## 2021-10-27 MED ORDER — IOHEXOL 300 MG/ML  SOLN
100.0000 mL | Freq: Once | INTRAMUSCULAR | Status: AC | PRN
  Administered 2021-10-27: 100 mL via INTRAVENOUS

## 2021-10-27 MED ORDER — FENTANYL CITRATE PF 50 MCG/ML IJ SOSY
25.0000 ug | PREFILLED_SYRINGE | Freq: Once | INTRAMUSCULAR | Status: AC
  Administered 2021-10-27: 25 ug via INTRAVENOUS
  Filled 2021-10-27: qty 1

## 2021-10-27 MED ORDER — KETOROLAC TROMETHAMINE 15 MG/ML IJ SOLN
15.0000 mg | Freq: Once | INTRAMUSCULAR | Status: AC
Start: 2021-10-27 — End: 2021-10-27
  Administered 2021-10-27: 15 mg via INTRAVENOUS
  Filled 2021-10-27: qty 1

## 2021-10-27 NOTE — ED Notes (Signed)
Pt complaining of nausea, RN Rannett informed

## 2021-10-27 NOTE — ED Triage Notes (Signed)
Pt c/o  continued LLQ w/ radiation to LT flank, NV; seen here on 12/27 for same, returned last night night [facility]BS d/t wait

## 2021-10-27 NOTE — ED Notes (Signed)
Patient transported to CT 

## 2021-10-27 NOTE — ED Provider Notes (Signed)
[facility] EMERGENCY DEPARTMENT Provider Note   CSN: [id number] Arrival date & time: 10/27/21  1825     History  Chief Complaint  Patient presents with   Abdominal Pain    Ellis Seay is a 21 y.o. female who presents with persistent left lower quadrant pain, nausea, and vomiting.  Seen here on 12/27 for the same diagnosis constipation.  Denies any vaginal bleeding or discharge, no diarrhea.  Continues to pass flatus and has had 1 small bowel movement since her last medical evaluation.  Has tried 1 dose of MiraLAX which did not taste abdomen preventing any further.  Has not had any other medications at home for constipation.   Reviewed with patient medical records.  Eating, ADHD, migraines, anxiety and constipation with previous prescription for milk of magnesia and lactulose.  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  HPI     Home Medications Prior to Admission medications   Medication Sig Start Date End Date Taking? Authorizing Provider  acetaminophen (TYLENOL) 325 MG tablet Take 2 tablets (650 mg total) by mouth every 6 (six) hours as needed. 09/25/18   Mesner, Barbara Cower, MD  albuterol (VENTOLIN HFA) 108 (90 Base) MCG/ACT inhaler Inhale into the lungs. 09/24/19   [provider]  buPROPion (WELLBUTRIN) 100 MG tablet Take 100 mg by mouth 2 (two) times daily. 09/21/21   [provider]  cephALEXin (KEFLEX) 500 MG capsule Take 1 capsule (500 mg total) by mouth 4 (four) times daily. 10/19/21   Redwine, Madison A, PA-C  cetirizine (ZYRTEC) 10 MG tablet Take 10 mg by mouth daily as needed. 11/04/19   [provider]  clomiPHENE (CLOMID) 50 MG tablet Take by mouth. 08/26/21   [provider]  Dexmethylphenidate HCl 30 MG CP24 Take 2 capsules every morning 09/25/17   Elveria Rising, NP  DULoxetine (CYMBALTA) 30 MG capsule TAKE 1 CAPSULE BY MOUTH ONCE DAILY 07/15/19   [provider]  esomeprazole  (NEXIUM) 20 MG capsule Take by mouth.    [provider]  etonogestrel (NEXPLANON) 68 MG IMPL implant 1 each by Subdermal route once.    [provider]  gabapentin (NEURONTIN) 300 MG capsule Take 300 mg by mouth 2 (two) times daily. 09/21/21   [provider]  ibuprofen (ADVIL) 400 MG tablet Take 1 tablet (400 mg total) by mouth as needed. 04/29/19   Elveria Rising, NP  lactulose (CHRONULAC) 10 GM/15ML solution Take by mouth 3 (three) times daily.    [provider]  loratadine (CLARITIN) 10 MG tablet Take 10 mg by mouth daily.    [provider]  magnesium hydroxide (MILK OF MAGNESIA) 400 MG/5ML suspension Take by mouth. 09/05/17   [provider]  methylPREDNISolone (MEDROL DOSEPAK) 4 MG TBPK tablet Take as directed 09/28/21   Horton, Kristie M, DO  ondansetron (ZOFRAN-ODT) 4 MG disintegrating tablet DISSOLVE 1 TABLET IN MOUTH AT ONSET OF NAUSEA, MAY REPEAT 1 TABLET IN 6 HOURS AS NEEDED 07/16/20   Elveria Rising, NP  pantoprazole (PROTONIX) 20 MG tablet Take 1 tablet (20 mg total) by mouth daily. 10/19/21   Redwine, Madison A, PA-C  promethazine (PHENERGAN) 25 MG tablet TAKE 1 TABLET BY MOUTH EVERY 6 HOURS AS NEEDED FOR NAUSEA AND VOMITING . APPOINTMENT REQUIRED FOR FUTURE REFILLS 10/19/20   Elveria Rising, NP  tiZANidine (ZANAFLEX) 4 MG tablet TAKE 1 TABLET BY MOUTH AT BEDTIME WHEN HEADACHE IS SEVERE 07/16/20   Elveria Rising, NP  triamcinolone cream (KENALOG)  0.1 % Apply 1 application topically 2 (two) times daily. Do not use on face for more than 5 days 02/26/21   Renne Crigler, PA-C      Allergies    Morphine and related, Compazine [prochlorperazine], and Metoclopramide    Review of Systems   Review of Systems  Constitutional:  Positive for appetite change and fatigue. Negative for chills and fever.  HENT: Negative.    Respiratory: Negative.    Cardiovascular: Negative.   Gastrointestinal:  Positive for abdominal pain,  constipation, nausea and vomiting. Negative for diarrhea.       NBNB emesis, no melena or hematochezia  Genitourinary: Negative.   Musculoskeletal: Negative.   Skin: Negative.   Neurological: Negative.   Hematological: Negative.   Psychiatric/Behavioral: Negative.     Physical Exam Updated Vital Signs BP 107/76    Pulse 68    Temp 98.1 F (36.7 C) (Oral)    Resp 18    Ht 5\' 1"  (1.549 m)    Wt 64.4 kg    LMP 10/18/2021    SpO2 96%    BMI 26.83 kg/m  Physical Exam Vitals and nursing note reviewed.  Constitutional:      Appearance: She is not ill-appearing or toxic-appearing.  HENT:     Head: Normocephalic and atraumatic.     Mouth/Throat:     Mouth: Mucous membranes are moist.     Pharynx: No oropharyngeal exudate or posterior oropharyngeal erythema.  Eyes:     General:        Right eye: No discharge.        Left eye: No discharge.     Conjunctiva/sclera: Conjunctivae normal.  Cardiovascular:     Rate and Rhythm: Normal rate and regular rhythm.     Pulses: Normal pulses.  Pulmonary:     Effort: Pulmonary effort is normal. No respiratory distress.     Breath sounds: Normal breath sounds. No wheezing or rales.  Abdominal:     General: Bowel sounds are normal. There is no distension.     Palpations: Abdomen is soft.     Tenderness: There is abdominal tenderness in the left lower quadrant. There is no right CVA tenderness, left CVA tenderness, guarding or rebound.  Genitourinary:    Comments: GU exam deferred by the patient following reassuring pelvic ultrasound Musculoskeletal:        General: No deformity.     Cervical back: Neck supple.  Skin:    General: Skin is warm and dry.     Capillary Refill: Capillary refill takes less than 2 seconds.  Neurological:     General: No focal deficit present.     Mental Status: She is alert and oriented to person, place, and time. Mental status is at baseline.  Psychiatric:        Mood and Affect: Mood normal.    ED Results /  Procedures / Treatments   Labs (all labs ordered are listed, but only abnormal results are displayed) Labs Reviewed  COMPREHENSIVE METABOLIC PANEL - Abnormal; Notable for the following components:      Result Value   Calcium 8.8 (*)    All other components within normal limits  CBC WITH DIFFERENTIAL/PLATELET  URINALYSIS, ROUTINE W REFLEX MICROSCOPIC  PREGNANCY, URINE    EKG None  Radiology 10/20/2021 PELVIS (TRANSABDOMINAL ONLY)  Result Date: 10/28/2021 CLINICAL DATA:  Left lower quadrant abdominal pain EXAM: TRANSABDOMINAL ULTRASOUND OF PELVIS DOPPLER ULTRASOUND OF OVARIES TECHNIQUE: Transabdominal ultrasound examination of the pelvis was performed including  evaluation of the uterus, ovaries, adnexal regions, and pelvic cul-de-sac. Color and duplex Doppler ultrasound was utilized to evaluate blood flow to the ovaries. COMPARISON:  CT 10/27/2021 FINDINGS: Uterus Measurements: 7.0 x 3.4 x 3.9 cm = volume: 48 mL. No fibroids or other mass visualized. Endometrium Thickness: 8 mm.  No focal abnormality visualized. Right ovary Measurements: 3.0 x 1.5 x 2.0 cm = volume: 4 mL. Normal appearance/no adnexal mass. Multiple follicles are noted. Left ovary Measurements: 4.0 x 3.1 x 3.5 cm = volume: 23 mL. Multiple simple cysts are seen within the left ovary without septation, mural nodularity or irregular wall thickening, or debris and are most in keeping with multiple follicular cysts or dominant follicles. Pulsed Doppler evaluation demonstrates normal low-resistance arterial and venous waveforms in both ovaries. Other: No free intraperitoneal fluid. IMPRESSION: Multiple simple cysts within the left ovary representing either dominant follicles or follicular cysts. Further follow-up is not required. Preserved bilateral ovarian vascularity. Electronically Signed   By: Helyn NumbersAshesh  Parikh M.D.   On: 10/28/2021 00:56   CT ABDOMEN PELVIS W CONTRAST  Result Date: 10/27/2021 CLINICAL DATA:  Left lower quadrant abdominal pain.  No flatus for 2 days. No bowel movement for 9 days. EXAM: CT ABDOMEN AND PELVIS WITH CONTRAST TECHNIQUE: Multidetector CT imaging of the abdomen and pelvis was performed using the standard protocol following bolus administration of intravenous contrast. CONTRAST:  100mL OMNIPAQUE IOHEXOL 300 MG/ML  SOLN COMPARISON:  CT 1 week ago 10/19/2021. Additional prior CTs reviewed. Pelvic ultrasound 03/02/2021 FINDINGS: Lower chest: The lung bases are clear. Hepatobiliary: Normal CT appearance of the liver. Decompressed gallbladder. No calcified gallstone. No biliary dilatation. Pancreas: Unremarkable. No pancreatic ductal dilatation or surrounding inflammatory changes. Spleen: Normal in size without focal abnormality. Adrenals/Urinary Tract: Adrenal glands are unremarkable. Kidneys are normal, without renal calculi, focal lesion, or hydronephrosis. Bladder is nondistended, equivocal wall thickening versus nondistention. Stomach/Bowel: Ingested material distends the stomach. No gastric wall thickening. Previously described small bowel fold thickening involving left abdominal bowel loops is no longer seen. Normal appearance of unenhanced small bowel. No obstruction or inflammation. Normal appendix. Moderate diffuse colonic stool burden. Mild sigmoid colonic redundancy. No colonic wall thickening or inflammation. No abnormal rectal distention. Vascular/Lymphatic: Normal caliber abdominal aorta. Patent portal vein. No acute vascular findings. No abdominopelvic adenopathy. Reproductive: Anteverted uterus. Left ovary is prominent in size with prominent follicles. No dominant ovarian cyst. Normal appearance of the right ovary. Other: Decreased free fluid in the pelvis. No abdominal ascites. No free air or focal fluid collection. No abdominal wall hernia Musculoskeletal: There are no acute or suspicious osseous abnormalities. IMPRESSION: 1. Left ovary is prominent in size with prominent follicles. No dominant ovarian cyst. Given  left lower quadrant pain, consider further assessment with pelvic ultrasound. 2. Previous small bowel fold thickening has resolved. 3. Moderate colonic stool burden with sigmoid colonic redundancy, suggesting constipation. No bowel obstruction or inflammation. 4. Equivocal bladder wall thickening versus nondistention. Electronically Signed   By: Narda RutherfordMelanie  Sanford M.D.   On: 10/27/2021 23:39   US PELVIC DOPPLER (TORSION R/O OR MASS ARTERIAL FLOW)  Result Date: 10/28/2021 CLINICAL DATA:  Left lower quadrant abdominal pain EXAM: TRANSABDOMINAL ULTRASOUND OF PELVIS DOPPLER ULTRASOUND OF OVARIES TECHNIQUE: Transabdominal ultrasound examination of the pelvis was performed including evaluation of the uterus, ovaries, adnexal regions, and pelvic cul-de-sac. Color and duplex Doppler ultrasound was utilized to evaluate blood flow to the ovaries. COMPARISON:  CT 10/27/2021 FINDINGS: Uterus Measurements: 7.0 x 3.4 x 3.9 cm = volume: 48  mL. No fibroids or other mass visualized. Endometrium Thickness: 8 mm.  No focal abnormality visualized. Right ovary Measurements: 3.0 x 1.5 x 2.0 cm = volume: 4 mL. Normal appearance/no adnexal mass. Multiple follicles are noted. Left ovary Measurements: 4.0 x 3.1 x 3.5 cm = volume: 23 mL. Multiple simple cysts are seen within the left ovary without septation, mural nodularity or irregular wall thickening, or debris and are most in keeping with multiple follicular cysts or dominant follicles. Pulsed Doppler evaluation demonstrates normal low-resistance arterial and venous waveforms in both ovaries. Other: No free intraperitoneal fluid. IMPRESSION: Multiple simple cysts within the left ovary representing either dominant follicles or follicular cysts. Further follow-up is not required. Preserved bilateral ovarian vascularity. Electronically Signed   By: Helyn NumbersAshesh  Parikh M.D.   On: 10/28/2021 00:56    Procedures Procedures    Medications Ordered in ED Medications  ondansetron (ZOFRAN)  injection 4 mg (4 mg Intravenous Given 10/27/21 2231)  ketorolac (TORADOL) 15 MG/ML injection 15 mg (15 mg Intravenous Given 10/27/21 2233)  fentaNYL (SUBLIMAZE) injection 25 mcg (25 mcg Intravenous Given 10/27/21 2233)  lactated ringers bolus 1,000 mL (0 mLs Intravenous Stopped 10/28/21 0017)  iohexol (OMNIPAQUE) 300 MG/ML solution 100 mL (100 mLs Intravenous Contrast Given 10/27/21 2323)  fentaNYL (SUBLIMAZE) injection 50 mcg (50 mcg Intravenous Given 10/28/21 0044)  ketorolac (TORADOL) 15 MG/ML injection 15 mg (15 mg Intravenous Given 10/28/21 0043)    ED Course/ Medical Decision Making/ A&P                           Medical Decision Making 21 year old female with history of constipation who presents with concern for persistent pain. Differential includes but is not limited to diverticulitis, ovarian torsion, ectopic pregnancy, constipation, mesenteric ischemia, appendicitis.  Vital signs are normal and intake.  Cardiopulmonary exam abdominal exam with left lower quadrant tenderness palpation without rebound or guarding.  No suprapubic tenderness to palpation.  No GU symptoms therefore GU exam deferred.  CBC unremarkable, CMP unremarkable.  UA without evidence of infection and pregnancy test was negative. CT of the abdomen and pelvis with obtained with prominent left ovary with multiple follicles without prominent ovarian cyst.  Ultrasound recommended by radiologist.  Moderate colonic stool burden without inflammation or obstruction.  Pelvic ultrasound was obtained which revealed multiple simple cyst left ovary which do not warrant follow-up.  Bilateral preserved ovarian vascularity.  Patient reevaluated administration of IV analgesia and antiemetic.  She is tolerating p.o. and feeling significantly improved.  Discussed the role of a rectal exam in context of patient's symptoms, however given very minimal intervention attempted in the outpatient setting and lack of obstruction on CT continue to pass  flatus, do not feel pain is infection and necessarily warranted at this time.  Shared decision making with the patient with ultimate decision to proceed with further attempt at outpatient management with milk of magnesia/suppositories/OTC enemas/MiraLAX bowel regimen with return precautions.  Lachlan voiced understanding for medical evaluation and treatment plan.  Each of her questions was answered to her expressed satisfaction.  Strict return precautions were given.  Suspect etiology of patient's symptoms truly is her underlying constipation which will require chronic management by her PCP.  Patient is well-appearing, stable, and appropriate for discharge.  This chart was dictated using voice recognition software, Dragon. Despite the best efforts of this provider to proofread and correct errors, errors may still occur which can change documentation meaning.  Final Clinical Impression(s) / ED  Diagnoses Final diagnoses:  Constipation, unspecified constipation type    Rx / DC Orders ED Discharge Orders     None         Sherrilee Gilles 10/29/21 0216    Vanetta Mulders, MD 11/04/21 [phone]

## 2021-10-28 ENCOUNTER — Emergency Department (HOSPITAL_BASED_OUTPATIENT_CLINIC_OR_DEPARTMENT_OTHER): Payer: Medicaid Other

## 2021-10-28 NOTE — Discharge Instructions (Addendum)
You are seen in the ER today for your abdominal pain.  You were found to still be constipated.  You may try over-the-counter milk of magnesia or pill forms of a laxative to attempt have a bowel movement.  This do not work you may try the following bowel prep: 1 bottle of MiraLAX mixed into 64 ounces of Gatorade until this dissolved.  Do not use red Gatorade.  Drink this in 8 ounce increments every 10 minutes until you have consumed the entire quantity.  You may use the Zofran you have at home as needed for your nausea.

## 2022-01-05 ENCOUNTER — Emergency Department (HOSPITAL_BASED_OUTPATIENT_CLINIC_OR_DEPARTMENT_OTHER)
Admission: EM | Admit: 2022-01-05 | Discharge: 2022-01-06 | Disposition: A | Discharge status: Home / Self Care | Payer: Medicaid Other | Attending: Emergency Medicine | Admitting: Emergency Medicine

## 2022-01-05 ENCOUNTER — Other Ambulatory Visit: Payer: Self-pay

## 2022-01-05 ENCOUNTER — Encounter (HOSPITAL_BASED_OUTPATIENT_CLINIC_OR_DEPARTMENT_OTHER): Payer: Self-pay | Admitting: Urology

## 2022-01-05 DIAGNOSIS — Z20822 Contact with and (suspected) exposure to covid-19: Secondary | ICD-10-CM | POA: Insufficient documentation

## 2022-01-05 DIAGNOSIS — R519 Headache, unspecified: Secondary | ICD-10-CM | POA: Diagnosis not present

## 2022-01-05 LAB — RESP PANEL BY RT-PCR (FLU A&B, COVID) ARPGX2
Influenza A by PCR: NEGATIVE
Influenza B by PCR: NEGATIVE
SARS Coronavirus 2 by RT PCR: NEGATIVE

## 2022-01-05 NOTE — ED Triage Notes (Signed)
Muscle tension to back of neck ?Flushing of face that started today  ?Headache that started today, reports nausea   ?H/o migraines  ?Sister positive for Flu  ?

## 2022-01-06 ENCOUNTER — Encounter (HOSPITAL_BASED_OUTPATIENT_CLINIC_OR_DEPARTMENT_OTHER): Payer: Self-pay | Admitting: Emergency Medicine

## 2022-01-06 ENCOUNTER — Emergency Department (HOSPITAL_BASED_OUTPATIENT_CLINIC_OR_DEPARTMENT_OTHER): Payer: Medicaid Other

## 2022-01-06 LAB — PREGNANCY, URINE: Preg Test, Ur: NEGATIVE

## 2022-01-06 MED ORDER — IBUPROFEN 800 MG PO TABS
800.0000 mg | ORAL_TABLET | Freq: Once | ORAL | Status: AC
Start: 2022-01-06 — End: 2022-01-06
  Administered 2022-01-06: 800 mg via ORAL
  Filled 2022-01-06: qty 1

## 2022-01-06 MED ORDER — METOCLOPRAMIDE HCL 10 MG PO TABS
10.0000 mg | ORAL_TABLET | Freq: Once | ORAL | Status: AC
  Administered 2022-01-06: 10 mg via ORAL
  Filled 2022-01-06: qty 1

## 2022-01-06 MED ORDER — DIPHENHYDRAMINE HCL 12.5 MG/5ML PO ELIX
12.5000 mg | ORAL_SOLUTION | Freq: Once | ORAL | Status: AC
  Administered 2022-01-06: 12.5 mg via ORAL
  Filled 2022-01-06: qty 10

## 2022-01-06 MED ORDER — ACETAMINOPHEN 500 MG PO TABS
1000.0000 mg | ORAL_TABLET | Freq: Once | ORAL | Status: AC
  Administered 2022-01-06: 1000 mg via ORAL
  Filled 2022-01-06: qty 2

## 2022-01-06 NOTE — ED Provider Notes (Signed)
?[facility] EMERGENCY DEPARTMENT ?Provider Note ? ? ?CSN: [id number] ?Arrival date & time: 01/05/22  2240 ? ?  ? ?History ? ?Chief Complaint  ?Patient presents with  ? Headache  ? ? ?Amanda Golden is a 21 y.o. female. ? ?The history is provided by the patient and a significant other.  ?Headache ?Pain location:  Occipital ?Quality:  Dull ?Radiates to:  Does not radiate ?Onset quality:  Gradual ?Duration:  1 day ?Timing:  Constant ?Progression:  Unchanged ?Chronicity:  Recurrent ?Similar to prior headaches: yes   ?Context: not activity, not exposure to bright light, not caffeine and not coughing   ?Relieved by:  Nothing ?Worsened by:  Nothing ?Ineffective treatments:  None tried ?Associated symptoms: no abdominal pain, no back pain, no blurred vision, no congestion, no cough, no diarrhea, no dizziness, no drainage, no ear pain, no eye pain, no facial pain, no fatigue, no fever, no focal weakness, no hearing loss, no loss of balance, no myalgias, no nausea, no near-syncope, no neck stiffness, no numbness, no paresthesias, no photophobia, no seizures, no sore throat, no swollen glands, no syncope, no tingling, no URI, no visual change, no vomiting and no weakness   ?Risk factors: no anger   ? ?  ? ?Home Medications ?Prior to Admission medications   ?Medication Sig Start Date End Date Taking? Authorizing Provider  ?acetaminophen (TYLENOL) 325 MG tablet Take 2 tablets (650 mg total) by mouth every 6 (six) hours as needed. 09/25/18   Mesner, Corene Cornea, MD  ?albuterol (VENTOLIN HFA) 108 (90 Base) MCG/ACT inhaler Inhale into the lungs. 09/24/19   [provider]  ?buPROPion (WELLBUTRIN) 100 MG tablet Take 100 mg by mouth 2 (two) times daily. 09/21/21   [provider]  ?cephALEXin (KEFLEX) 500 MG capsule Take 1 capsule (500 mg total) by mouth 4 (four) times daily. 10/19/21   Redwine, Madison A, PA-C  ?cetirizine (ZYRTEC) 10 MG tablet Take 10 mg by mouth daily as needed. 11/04/19   [provider]   ?clomiPHENE (CLOMID) 50 MG tablet Take by mouth. 08/26/21   [provider]  ?Dexmethylphenidate HCl 30 MG CP24 Take 2 capsules every morning 09/25/17   Rockwell Germany, NP  ?DULoxetine (CYMBALTA) 30 MG capsule TAKE 1 CAPSULE BY MOUTH ONCE DAILY 07/15/19   [provider]  ?esomeprazole (NEXIUM) 20 MG capsule Take by mouth.    [provider]  ?etonogestrel (NEXPLANON) 68 MG IMPL implant 1 each by Subdermal route once.    [provider]  ?gabapentin (NEURONTIN) 300 MG capsule Take 300 mg by mouth 2 (two) times daily. 09/21/21   [provider]  ?ibuprofen (ADVIL) 400 MG tablet Take 1 tablet (400 mg total) by mouth as needed. 04/29/19   Rockwell Germany, NP  ?lactulose (CHRONULAC) 10 GM/15ML solution Take by mouth 3 (three) times daily.    [provider]  ?loratadine (CLARITIN) 10 MG tablet Take 10 mg by mouth daily.    [provider]  ?magnesium hydroxide (MILK OF MAGNESIA) 400 MG/5ML suspension Take by mouth. 09/05/17   [provider]  ?methylPREDNISolone (MEDROL DOSEPAK) 4 MG TBPK tablet Take as directed 09/28/21   Horton, Alvin Critchley, DO  ?ondansetron (ZOFRAN-ODT) 4 MG disintegrating tablet DISSOLVE 1 TABLET IN MOUTH AT ONSET OF NAUSEA, MAY REPEAT 1 TABLET IN 6 HOURS AS NEEDED 07/16/20   Rockwell Germany, NP  ?pantoprazole (PROTONIX) 20 MG tablet Take 1 tablet (20 mg total) by mouth daily. 10/19/21   Redwine, Madison A, PA-C  ?promethazine (  PHENERGAN) 25 MG tablet TAKE 1 TABLET BY MOUTH EVERY 6 HOURS AS NEEDED FOR NAUSEA AND VOMITING . APPOINTMENT REQUIRED FOR FUTURE REFILLS 10/19/20   Rockwell Germany, NP  ?tiZANidine (ZANAFLEX) 4 MG tablet TAKE 1 TABLET BY MOUTH AT BEDTIME WHEN HEADACHE IS SEVERE 07/16/20   Rockwell Germany, NP  ?triamcinolone cream (KENALOG) 0.1 % Apply 1 application topically 2 (two) times daily. Do not use on face for more than 5 days 02/26/21   Carlisle Cater, PA-C  ?   ? ?Allergies    ?Patient has no known allergies.    ? ?Review of Systems   ?Review of Systems  ?Constitutional:  Negative for fatigue and fever.  ?HENT:  Negative for congestion, ear pain, hearing loss, postnasal drip and sore throat.   ?Eyes:  Negative for blurred vision, photophobia and pain.  ?Respiratory:  Negative for cough.   ?Cardiovascular:  Negative for chest pain, syncope and near-syncope.  ?Gastrointestinal:  Negative for abdominal pain, diarrhea, nausea and vomiting.  ?Genitourinary:  Negative for difficulty urinating.  ?Musculoskeletal:  Negative for back pain, myalgias and neck stiffness.  ?Neurological:  Positive for headaches. Negative for dizziness, tremors, focal weakness, seizures, facial asymmetry, speech difficulty, weakness, numbness, paresthesias and loss of balance.  ?All other systems reviewed and are negative. ? ?Physical Exam ?Updated Vital Signs ?BP 106/75 (BP Location: Right Arm)   Pulse 84   Temp 97.6 ?F (36.4 ?C) (Oral)   Resp 20   Ht 5\' 1"  (1.549 m)   Wt 74.8 kg   LMP 12/16/2021 (Exact Date)   SpO2 99%   BMI 31.18 kg/m?  ?Physical Exam ?Vitals and nursing note reviewed.  ?Constitutional:   ?   General: She is not in acute distress. ?   Appearance: Normal appearance.  ?HENT:  ?   Head: Normocephalic and atraumatic.  ?   Nose: Nose normal.  ?   Mouth/Throat:  ?   Mouth: Mucous membranes are moist.  ?   Pharynx: Oropharynx is clear.  ?Eyes:  ?   Extraocular Movements: Extraocular movements intact.  ?   Conjunctiva/sclera: Conjunctivae normal.  ?   Pupils: Pupils are equal, round, and reactive to light.  ?Cardiovascular:  ?   Rate and Rhythm: Normal rate and regular rhythm.  ?   Pulses: Normal pulses.  ?   Heart sounds: Normal heart sounds.  ?Pulmonary:  ?   Effort: Pulmonary effort is normal.  ?   Breath sounds: Normal breath sounds.  ?Abdominal:  ?   General: Bowel sounds are normal.  ?   Palpations: Abdomen is soft.  ?   Tenderness: There is no abdominal tenderness. There is no guarding.  ?Musculoskeletal:     ?   General:  Normal range of motion.  ?   Cervical back: Normal range of motion and neck supple. No rigidity.  ?Lymphadenopathy:  ?   Cervical: No cervical adenopathy.  ?Skin: ?   General: Skin is warm and dry.  ?   Capillary Refill: Capillary refill takes less than 2 seconds.  ?Neurological:  ?   General: No focal deficit present.  ?   Mental Status: She is alert and oriented to person, place, and time.  ?   Cranial Nerves: No cranial nerve deficit.  ?   Deep Tendon Reflexes: Reflexes normal.  ?Psychiatric:     ?   Mood and Affect: Mood normal.     ?   Behavior: Behavior normal.  ? ? ?ED Results / Procedures / Treatments   ?  Labs ?(all labs ordered are listed, but only abnormal results are displayed) ?Results for orders placed or performed during the hospital encounter of 01/05/22  ?Resp Panel by RT-PCR (Flu A&B, Covid) Nasopharyngeal Swab  ? Specimen: Nasopharyngeal Swab; Nasopharyngeal(NP) swabs in vial transport medium  ?Result Value Ref Range  ? SARS Coronavirus 2 by RT PCR NEGATIVE NEGATIVE  ? Influenza A by PCR NEGATIVE NEGATIVE  ? Influenza B by PCR NEGATIVE NEGATIVE  ?Pregnancy, urine  ?Result Value Ref Range  ? Preg Test, Ur NEGATIVE NEGATIVE  ? ?CT Head Wo Contrast ? ?Result Date: 01/06/2022 ?CLINICAL DATA:  Facial trauma, penetrating EXAM: CT HEAD WITHOUT CONTRAST TECHNIQUE: Contiguous axial images were obtained from the base of the skull through the vertex without intravenous contrast. RADIATION DOSE REDUCTION: This exam was performed according to the departmental dose-optimization program which includes automated exposure control, adjustment of the mA and/or kV according to patient size and/or use of iterative reconstruction technique. COMPARISON:  03/20/2021 FINDINGS: Brain: No acute intracranial abnormality. Specifically, no hemorrhage, hydrocephalus, mass lesion, acute infarction, or significant intracranial injury. Vascular: No hyperdense vessel or unexpected calcification. Skull: No acute calvarial abnormality.  Sinuses/Orbits: Mucosal thickening throughout the paranasal sinuses. Other: None IMPRESSION: No acute intracranial abnormality. Chronic sinusitis. Electronically Signed   By: Rolm Baptise M.D.   On: 01/06/2022 01

## 2022-03-24 ENCOUNTER — Emergency Department (HOSPITAL_BASED_OUTPATIENT_CLINIC_OR_DEPARTMENT_OTHER)
Admission: EM | Admit: 2022-03-24 | Discharge: 2022-03-24 | Disposition: A | Discharge status: Home / Self Care | Payer: Medicaid Other | Attending: Emergency Medicine | Admitting: Emergency Medicine

## 2022-03-24 ENCOUNTER — Emergency Department (HOSPITAL_BASED_OUTPATIENT_CLINIC_OR_DEPARTMENT_OTHER): Payer: Medicaid Other

## 2022-03-24 ENCOUNTER — Encounter (HOSPITAL_BASED_OUTPATIENT_CLINIC_OR_DEPARTMENT_OTHER): Payer: Self-pay

## 2022-03-24 DIAGNOSIS — Z87891 Personal history of nicotine dependence: Secondary | ICD-10-CM | POA: Insufficient documentation

## 2022-03-24 DIAGNOSIS — R519 Headache, unspecified: Secondary | ICD-10-CM | POA: Diagnosis present

## 2022-03-24 DIAGNOSIS — G43809 Other migraine, not intractable, without status migrainosus: Secondary | ICD-10-CM | POA: Diagnosis not present

## 2022-03-24 LAB — CBC WITH DIFFERENTIAL/PLATELET
Abs Immature Granulocytes: 0.01 10*3/uL (ref 0.00–0.07)
Basophils Absolute: 0 10*3/uL (ref 0.0–0.1)
Basophils Relative: 1 %
Eosinophils Absolute: 0.1 10*3/uL (ref 0.0–0.5)
Eosinophils Relative: 2 %
HCT: 38.9 % (ref 36.0–46.0)
Hemoglobin: 12.8 g/dL (ref 12.0–15.0)
Immature Granulocytes: 0 %
Lymphocytes Relative: 39 %
Lymphs Abs: 1.8 10*3/uL (ref 0.7–4.0)
MCH: 27 pg (ref 26.0–34.0)
MCHC: 32.9 g/dL (ref 30.0–36.0)
MCV: 82.1 fL (ref 80.0–100.0)
Monocytes Absolute: 0.6 10*3/uL (ref 0.1–1.0)
Monocytes Relative: 13 %
Neutro Abs: 2.1 10*3/uL (ref 1.7–7.7)
Neutrophils Relative %: 45 %
Platelets: 241 10*3/uL (ref 150–400)
RBC: 4.74 MIL/uL (ref 3.87–5.11)
RDW: 13.5 % (ref 11.5–15.5)
WBC: 4.7 10*3/uL (ref 4.0–10.5)
nRBC: 0 % (ref 0.0–0.2)

## 2022-03-24 LAB — BASIC METABOLIC PANEL
Anion gap: 8 (ref 5–15)
BUN: 11 mg/dL (ref 6–20)
CO2: 24 mmol/L (ref 22–32)
Calcium: 9.1 mg/dL (ref 8.9–10.3)
Chloride: 106 mmol/L (ref 98–111)
Creatinine, Ser: 0.7 mg/dL (ref 0.44–1.00)
GFR, Estimated: >60 mL/min (ref >60)
Glucose, Bld: 94 mg/dL (ref 70–99)
Potassium: 3.9 mmol/L (ref 3.5–5.1)
Sodium: 138 mmol/L (ref 135–145)

## 2022-03-24 LAB — HCG, QUANTITATIVE, PREGNANCY: hCG, Beta Chain, Quant, S: <1 m[IU]/mL (ref <5)

## 2022-03-24 MED ORDER — IOHEXOL 300 MG/ML  SOLN: 100.0000 mL | Freq: Once | INTRAMUSCULAR | Status: DC | PRN

## 2022-03-24 MED ORDER — SODIUM CHLORIDE 0.9 % IV BOLUS
1000.0000 mL | Freq: Once | INTRAVENOUS | Status: AC
  Administered 2022-03-24: 1000 mL via INTRAVENOUS

## 2022-03-24 MED ORDER — ACETAMINOPHEN 325 MG PO TABS
650.0000 mg | ORAL_TABLET | Freq: Once | ORAL | Status: AC
Start: 2022-03-24 — End: 2022-03-24
  Administered 2022-03-24: 650 mg via ORAL
  Filled 2022-03-24: qty 2

## 2022-03-24 MED ORDER — SUMATRIPTAN SUCCINATE 25 MG PO TABS: 25.0000 mg | ORAL_TABLET | ORAL | 0 refills | Status: AC | PRN

## 2022-03-24 MED ORDER — METOCLOPRAMIDE HCL 5 MG/ML IJ SOLN
5.0000 mg | Freq: Once | INTRAMUSCULAR | Status: AC
  Administered 2022-03-24: 5 mg via INTRAVENOUS
  Filled 2022-03-24: qty 2

## 2022-03-24 MED ORDER — IOHEXOL 350 MG/ML SOLN
100.0000 mL | Freq: Once | INTRAVENOUS | Status: AC | PRN
  Administered 2022-03-24: 75 mL via INTRAVENOUS

## 2022-03-24 MED ORDER — DIPHENHYDRAMINE HCL 50 MG/ML IJ SOLN
25.0000 mg | Freq: Once | INTRAMUSCULAR | Status: AC
  Administered 2022-03-24: 25 mg via INTRAVENOUS
  Filled 2022-03-24: qty 1

## 2022-03-24 MED ORDER — MAGNESIUM SULFATE 2 GM/50ML IV SOLN
2.0000 g | Freq: Once | INTRAVENOUS | Status: AC
Start: 2022-03-24 — End: 2022-03-24
  Administered 2022-03-24: 2 g via INTRAVENOUS
  Filled 2022-03-24: qty 50

## 2022-03-24 MED ORDER — LIDOCAINE 5 % EX PTCH
1.0000 | MEDICATED_PATCH | CUTANEOUS | Status: DC
  Administered 2022-03-24: 1 via TRANSDERMAL
  Filled 2022-03-24: qty 1

## 2022-03-24 MED ORDER — DEXAMETHASONE 4 MG PO TABS
6.0000 mg | ORAL_TABLET | Freq: Once | ORAL | Status: AC
  Administered 2022-03-24: 6 mg via ORAL
  Filled 2022-03-24: qty 2

## 2022-03-24 NOTE — ED Provider Notes (Signed)
[facility] EMERGENCY DEPARTMENT Provider Note   CSN: [id number] Arrival date & time: 03/24/22  4098     History  Chief Complaint  Patient presents with   Migraine    Amanda Golden is a 21 y.o. female.  Patient as above with significant medical history as below, including HD, anxiety, depression, hypoglycemia, migraine who presents to the ED with complaint of headache.  Patient reports patient reports onset of headache last night.  Described as a throbbing, pulsing sensation, bifrontal and left-sided temporal.  She had some mild nausea no vomiting.  Reports photophobia and phonophobia.  No fevers.  No IV drug use.  No head injury.  Also reports blurry vision.  Worse than her prior headaches.  Took her home tizanidine which did not alleviate her symptoms.     Past Medical History:  Diagnosis Date   ADHD (attention deficit hyperactivity disorder)    Anorexia nervosa with bulimia    Anxiety    Depression    DUB (dysfunctional uterine bleeding)    Eating disorder    Hypoglycemia    Migraines    Ovarian cyst     Past Surgical History:  Procedure Laterality Date   INTRAUTERINE DEVICE INSERTION     TONSILLECTOMY     TYMPANOSTOMY TUBE PLACEMENT        Migraine Associated symptoms include headaches. Pertinent negatives include no chest pain, no abdominal pain and no shortness of breath.      Home Medications Prior to Admission medications   Medication Sig Start Date End Date Taking? Authorizing Provider  SUMAtriptan (IMITREX) 25 MG tablet Take 1 tablet (25 mg total) by mouth every 2 (two) hours as needed for migraine. May repeat in 2 hours if headache persists or recurs. Max 2 dose daily 03/24/22  Yes Tanda Rockers A, DO  acetaminophen (TYLENOL) 325 MG tablet Take 2 tablets (650 mg total) by mouth every 6 (six) hours as needed. 09/25/18   Mesner, Barbara Cower, MD  albuterol (VENTOLIN HFA) 108 (90 Base) MCG/ACT inhaler Inhale into the lungs. 09/24/19   [provider]   buPROPion (WELLBUTRIN) 100 MG tablet Take 100 mg by mouth 2 (two) times daily. 09/21/21   [provider]  cephALEXin (KEFLEX) 500 MG capsule Take 1 capsule (500 mg total) by mouth 4 (four) times daily. 10/19/21   Redwine, Madison A, PA-C  cetirizine (ZYRTEC) 10 MG tablet Take 10 mg by mouth daily as needed. 11/04/19   [provider]  clomiPHENE (CLOMID) 50 MG tablet Take by mouth. 08/26/21   [provider]  Dexmethylphenidate HCl 30 MG CP24 Take 2 capsules every morning 09/25/17   Elveria Rising, NP  DULoxetine (CYMBALTA) 30 MG capsule TAKE 1 CAPSULE BY MOUTH ONCE DAILY 07/15/19   [provider]  esomeprazole (NEXIUM) 20 MG capsule Take by mouth.    [provider]  etonogestrel (NEXPLANON) 68 MG IMPL implant 1 each by Subdermal route once.    [provider]  gabapentin (NEURONTIN) 300 MG capsule Take 300 mg by mouth 2 (two) times daily. 09/21/21   [provider]  ibuprofen (ADVIL) 400 MG tablet Take 1 tablet (400 mg total) by mouth as needed. 04/29/19   Elveria Rising, NP  lactulose (CHRONULAC) 10 GM/15ML solution Take by mouth 3 (three) times daily.    [provider]  loratadine (CLARITIN) 10 MG tablet Take 10 mg by mouth daily.    [provider]  magnesium hydroxide (MILK OF MAGNESIA) 400 MG/5ML suspension Take by  mouth. 09/05/17   [provider]  methylPREDNISolone (MEDROL DOSEPAK) 4 MG TBPK tablet Take as directed 09/28/21   Horton, Kristie M, DO  ondansetron (ZOFRAN-ODT) 4 MG disintegrating tablet DISSOLVE 1 TABLET IN MOUTH AT ONSET OF NAUSEA, MAY REPEAT 1 TABLET IN 6 HOURS AS NEEDED 07/16/20   Elveria Rising, NP  pantoprazole (PROTONIX) 20 MG tablet Take 1 tablet (20 mg total) by mouth daily. 10/19/21   Redwine, Madison A, PA-C  promethazine (PHENERGAN) 25 MG tablet TAKE 1 TABLET BY MOUTH EVERY 6 HOURS AS NEEDED FOR NAUSEA AND VOMITING . APPOINTMENT REQUIRED FOR FUTURE REFILLS 10/19/20    Elveria Rising, NP  tiZANidine (ZANAFLEX) 4 MG tablet TAKE 1 TABLET BY MOUTH AT BEDTIME WHEN HEADACHE IS SEVERE 07/16/20   Elveria Rising, NP  triamcinolone cream (KENALOG) 0.1 % Apply 1 application topically 2 (two) times daily. Do not use on face for more than 5 days 02/26/21   Renne Crigler, PA-C      Allergies    Patient has no known allergies.    Review of Systems   Review of Systems  Constitutional:  Negative for chills and fever.  HENT:  Negative for facial swelling and trouble swallowing.   Eyes:  Positive for photophobia and visual disturbance.  Respiratory:  Negative for cough and shortness of breath.   Cardiovascular:  Negative for chest pain and palpitations.  Gastrointestinal:  Positive for nausea. Negative for abdominal pain and vomiting.  Endocrine: Negative for polydipsia and polyuria.  Genitourinary:  Negative for difficulty urinating and hematuria.  Musculoskeletal:  Negative for gait problem and joint swelling.  Skin:  Negative for pallor and rash.  Neurological:  Positive for headaches. Negative for syncope.  Psychiatric/Behavioral:  Negative for agitation and confusion.    Physical Exam Updated Vital Signs BP 106/73   Pulse 78   Temp 97.9 F (36.6 C) (Oral)   Resp 16   Ht 5\' 1"  (1.549 m)   Wt 77.1 kg   LMP 02/14/2022 (Exact Date)   SpO2 100%   BMI 32.12 kg/m  Physical Exam Vitals and nursing note reviewed.  Constitutional:      General: She is not in acute distress.    Appearance: Normal appearance. She is obese. She is not diaphoretic.  HENT:     Head: Normocephalic and atraumatic.     Right Ear: External ear normal.     Left Ear: External ear normal.     Nose: Nose normal.     Mouth/Throat:     Mouth: Mucous membranes are moist.  Eyes:     General: No visual field deficit or scleral icterus.       Right eye: No discharge.        Left eye: No discharge.     Extraocular Movements: Extraocular movements intact.     Pupils: Pupils are equal,  round, and reactive to light.  Cardiovascular:     Rate and Rhythm: Normal rate and regular rhythm.     Pulses: Normal pulses.     Heart sounds: Normal heart sounds.  Pulmonary:     Effort: Pulmonary effort is normal. No respiratory distress.     Breath sounds: Normal breath sounds.  Abdominal:     General: Abdomen is flat.     Tenderness: There is no abdominal tenderness.  Musculoskeletal:        General: Normal range of motion.     Cervical back: Normal range of motion.     Right lower leg: No  edema.     Left lower leg: No edema.  Skin:    General: Skin is warm and dry.     Capillary Refill: Capillary refill takes less than 2 seconds.  Neurological:     Mental Status: She is alert and oriented to person, place, and time.     GCS: GCS eye subscore is 4. GCS verbal subscore is 5. GCS motor subscore is 6.     Cranial Nerves: Cranial nerves 2-12 are intact. No dysarthria or facial asymmetry.     Sensory: Sensation is intact.     Motor: Motor function is intact.     Coordination: Coordination is intact. Finger-Nose-Finger Test normal.     Gait: Gait is intact.  Psychiatric:        Mood and Affect: Mood normal.        Behavior: Behavior normal.    ED Results / Procedures / Treatments   Labs (all labs ordered are listed, but only abnormal results are displayed) Labs Reviewed  CBC WITH DIFFERENTIAL/PLATELET  BASIC METABOLIC PANEL  HCG, QUANTITATIVE, PREGNANCY    EKG None  Radiology CT VENOGRAM HEAD  Result Date: 03/24/2022 CLINICAL DATA:  Dural venous sinus thrombosis suspected EXAM: CT VENOGRAM HEAD TECHNIQUE: CT of the head was obtained by department protocol without contrast. Venographic phase images of the brain were obtained following the administration of intravenous contrast. Multiplanar reformats and maximum intensity projections were generated. RADIATION DOSE REDUCTION: This exam was performed according to the departmental dose-optimization program which includes  automated exposure control, adjustment of the mA and/or kV according to patient size and/or use of iterative reconstruction technique. CONTRAST:  75 mL Omnipaque 350 COMPARISON:  None Available. FINDINGS: CT HEAD: Brain: There is no acute intracranial hemorrhage, mass effect, or edema. Arbell Wycoff-white differentiation is preserved. There is no extra-axial fluid collection. Ventricles and sulci are within normal limits in size and configuration. Vascular: No hyperdense vessel. Skull: Calvarium is unremarkable. Sinuses/Orbits: Patchy mucosal thickening.  Orbits are unremarkable. Other: None. CTV HEAD: Superior sagittal sinus, straight sinus, vein of Galen, and internal cerebral veins are patent. Transverse and sigmoid sinuses are patent. IMPRESSION: No acute intracranial abnormality. No evidence of dural sinus thrombosis. Electronically Signed   By: Guadlupe Spanish M.D.   On: 03/24/2022 11:26    Procedures Procedures    Medications Ordered in ED Medications  lidocaine (LIDODERM) 5 % 1 patch (1 patch Transdermal Patch Applied 03/24/22 0916)  iohexol (OMNIPAQUE) 300 MG/ML solution 100 mL ( Intravenous Canceled Entry 03/24/22 1000)  metoCLOPramide (REGLAN) injection 5 mg (5 mg Intravenous Given 03/24/22 0926)  diphenhydrAMINE (BENADRYL) injection 25 mg (25 mg Intravenous Given 03/24/22 0926)  acetaminophen (TYLENOL) tablet 650 mg (650 mg Oral Given 03/24/22 0916)  magnesium sulfate IVPB 2 g 50 mL (0 g Intravenous Stopped 03/24/22 1029)  sodium chloride 0.9 % bolus 1,000 mL (0 mLs Intravenous Stopped 03/24/22 1110)  iohexol (OMNIPAQUE) 350 MG/ML injection 100 mL (75 mLs Intravenous Contrast Given 03/24/22 1100)  dexamethasone (DECADRON) tablet 6 mg (6 mg Oral Given 03/24/22 1149)    ED Course/ Medical Decision Making/ A&P                           Medical Decision Making Amount and/or Complexity of Data Reviewed Labs: ordered. Radiology: ordered.  Risk OTC drugs. Prescription drug management.    CC:  Headache  This patient presents to the Emergency Department for the above complaint. This involves an extensive number  of treatment options and is a complaint that carries with it a high risk of complications and morbidity. Vital signs were reviewed. Serious etiologies considered.  Differential diagnosis includes but is not exclusive to subarachnoid hemorrhage, meningitis, encephalitis, previous head trauma, cavernous venous thrombosis, muscle tension headache, glaucoma, temporal arteritis, migraine or migraine equivalent, etc.   Record review:  Previous records obtained and reviewed  Prior ED visits, prior labs and imaging  Additional history obtained from mother  Medical and surgical history as noted above.   Work up as above, notable for:  Labs & imaging results that were available during my care of the patient were visualized by me and considered in my medical decision making.   I ordered imaging studies which included CT venogram. I visualized the imaging, interpreted images, and I agree with radiologist interpretation.  No acute process  Labs reviewed and are stable.  Management: Headache cocktail, IV fluids, Decadron  Reassessment:  Symptoms resolved.  Admission was considered.   Neurologically she is intact.  No proptosis.  Vision is back to normal.  Ambulatory with steady gait.  No nausea or vomiting.  She is time p.o. intake without difficulty.  At baseline per mother at bedside.  Favor complex migraine as etiology of complaints today.  Patient reports her tizanidine does not typically help with her headaches, we will transition her to Imitrex, follow-up with PCP. Headache diary, avoid known triggers.   The patient improved significantly and was discharged in stable condition. Detailed discussions were had with the patient regarding current findings, and need for close f/u with PCP or on call doctor. The patient has been instructed to return immediately if the symptoms worsen  in any way for re-evaluation. Patient verbalized understanding and is in agreement with current care plan. All questions answered prior to discharge.                Social determinants of health include -  Social History   Socioeconomic History   Marital status: Single    Spouse name: Not on file   Number of children: Not on file   Years of education: Not on file   Highest education level: Not on file  Occupational History   Not on file  Tobacco Use   Smoking status: Former    Types: Cigarettes   Smokeless tobacco: Never  Vaping Use   Vaping Use: Every day  Substance and Sexual Activity   Alcohol use: Not Currently   Drug use: Never   Sexual activity: Not on file  Other Topics Concern   Not on file  Social History Narrative   School Name: Graduate [employer]/ will be attending [employer].   How does patient do in school: average   Patient lives with: mother, adopted sister   Does patient have and IEP/504 Plan in school? Yes   If so, is the patient meeting goals? No   Does patient receive therapies? No   If yes, what kind and how often? none   What are the patient's hobbies or interest? Ride 4 wheeler, otherwise states doesn't like to do anything but stay in her room   Social Determinants of Health   Financial Resource Strain: Not on file  Food Insecurity: Not on file  Transportation Needs: Not on file  Physical Activity: Not on file  Stress: Not on file  Social Connections: Not on file  Intimate Partner Violence: Not on file      This chart was dictated using voice recognition  software.  Despite best efforts to proofread,  errors can occur which can change the documentation meaning.         Final Clinical Impression(s) / ED Diagnoses Final diagnoses:  Other migraine without status migrainosus, not intractable    Rx / DC Orders ED Discharge Orders          Ordered    SUMAtriptan (IMITREX) 25 MG tablet  Every 2 hours PRN         03/24/22 1139              Sloan Leiter, DO 03/24/22 1152

## 2022-03-24 NOTE — ED Triage Notes (Signed)
C/o migraine since last night, takes tizanidine for same. C/o nausea & "film over eyes", light sensitivity.

## 2022-03-24 NOTE — Discharge Instructions (Signed)
It was a pleasure caring for you today in the emergency department. ° °Please return to the emergency department for any worsening or worrisome symptoms. ° ° °

## 2022-04-16 ENCOUNTER — Encounter (HOSPITAL_BASED_OUTPATIENT_CLINIC_OR_DEPARTMENT_OTHER): Payer: Self-pay | Admitting: Emergency Medicine

## 2022-04-16 ENCOUNTER — Other Ambulatory Visit: Payer: Self-pay

## 2022-04-16 ENCOUNTER — Emergency Department (HOSPITAL_BASED_OUTPATIENT_CLINIC_OR_DEPARTMENT_OTHER): Payer: Medicaid Other

## 2022-04-16 ENCOUNTER — Emergency Department (HOSPITAL_BASED_OUTPATIENT_CLINIC_OR_DEPARTMENT_OTHER)
Admission: EM | Admit: 2022-04-16 | Discharge: 2022-04-16 | Disposition: A | Discharge status: Home / Self Care | Payer: Medicaid Other | Attending: Emergency Medicine | Admitting: Emergency Medicine

## 2022-04-16 DIAGNOSIS — Y9389 Activity, other specified: Secondary | ICD-10-CM | POA: Diagnosis not present

## 2022-04-16 DIAGNOSIS — Y92009 Unspecified place in unspecified non-institutional (private) residence as the place of occurrence of the external cause: Secondary | ICD-10-CM | POA: Insufficient documentation

## 2022-04-16 DIAGNOSIS — S8991XA Unspecified injury of right lower leg, initial encounter: Secondary | ICD-10-CM | POA: Diagnosis present

## 2022-04-16 DIAGNOSIS — W19XXXA Unspecified fall, initial encounter: Secondary | ICD-10-CM

## 2022-04-16 DIAGNOSIS — M25561 Pain in right knee: Secondary | ICD-10-CM

## 2022-04-16 DIAGNOSIS — W0110XA Fall on same level from slipping, tripping and stumbling with subsequent striking against unspecified object, initial encounter: Secondary | ICD-10-CM | POA: Insufficient documentation

## 2022-04-16 DIAGNOSIS — M25571 Pain in right ankle and joints of right foot: Secondary | ICD-10-CM

## 2022-04-16 MED ORDER — KETOROLAC TROMETHAMINE 30 MG/ML IJ SOLN
30.0000 mg | Freq: Once | INTRAMUSCULAR | Status: AC
  Administered 2022-04-16: 30 mg via INTRAMUSCULAR
  Filled 2022-04-16: qty 1

## 2022-04-16 NOTE — ED Triage Notes (Signed)
Pt arrives pov, to triage in wheelchair, endorses mechanical slip and fall early am. Pt c/o RLE pain from right hip to foot. Reports difficulty bearing wt.

## 2022-04-16 NOTE — ED Notes (Signed)
X-ray at bedside

## 2022-04-21 ENCOUNTER — Ambulatory Visit: Payer: Medicaid Other | Admitting: Family Medicine

## 2023-02-07 ENCOUNTER — Encounter: Payer: Self-pay | Admitting: *Deleted

## 2024-04-25 IMAGING — CT CT VENOGRAM HEAD
1 of 10 series · 6 of 47 positions shown · IV contrast (Omnipaque)
Comparison: None Available.

CLINICAL DATA: Dural venous sinus thrombosis suspected

EXAM:
CT VENOGRAM HEAD
TECHNIQUE: CT of the head was obtained by department protocol without contrast.
Venographic phase images of the brain were obtained following the
administration of intravenous contrast. Multiplanar reformats and
maximum intensity projections were generated.
RADIATION DOSE REDUCTION: This exam was performed according to the
departmental dose-optimization program which includes automated
exposure control, adjustment of the mA and/or kV according to
patient size and/or use of iterative reconstruction technique.
CONTRAST:  75 mL Omnipaque 350

[Series 6: head venogram · axial · 0.43mm/px · z∈[+1044,+1162]mm · 6 of 83 slices shown]
[im 12/83  brain]
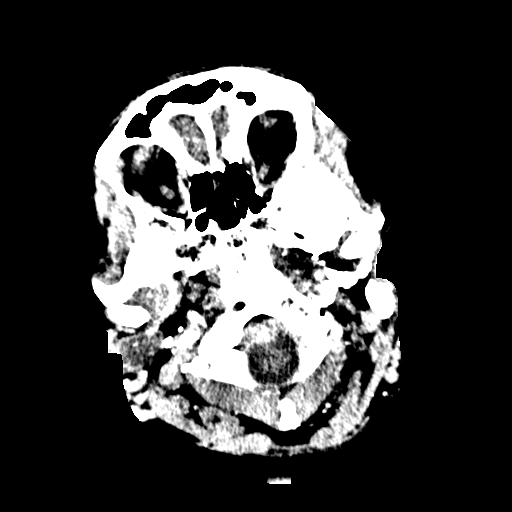
[im 24/83  bone]
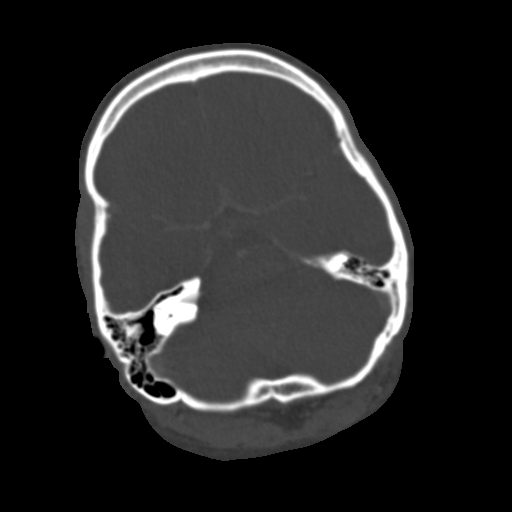
[im 36/83  brain]
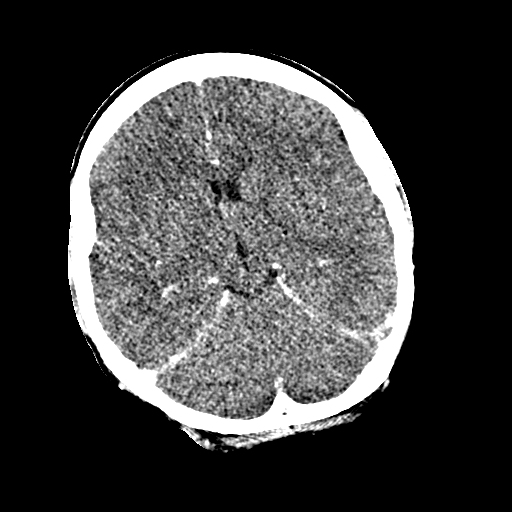
[im 47/83  bone]
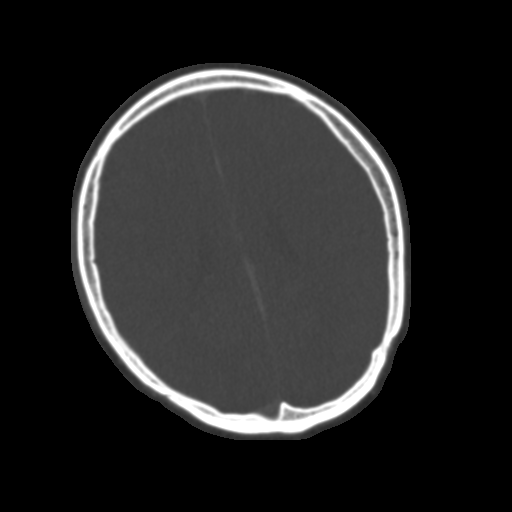
[im 59/83  brain]
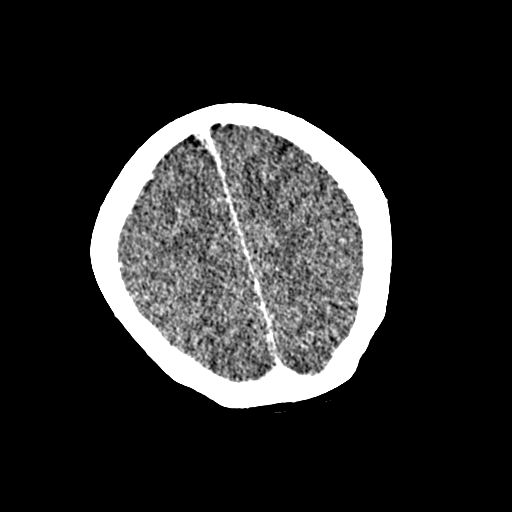
[im 71/83  bone]
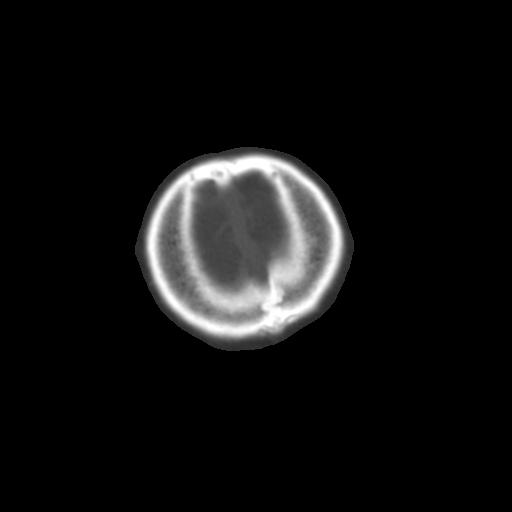

[6 of 47 positions shown; findings below may reference images not displayed]

FINDINGS: CT HEAD:

Brain: There is no acute intracranial hemorrhage, mass effect, or
edema. Gray-white differentiation is preserved. There is no
extra-axial fluid collection. Ventricles and sulci are within normal
limits in size and configuration.

Vascular: No hyperdense vessel.

Skull: Calvarium is unremarkable.

Sinuses/Orbits: Patchy mucosal thickening.  Orbits are unremarkable.

Other: None.

CTV HEAD:

Superior sagittal sinus, straight sinus, vein of Tiger, and internal
cerebral veins are patent. Transverse and sigmoid sinuses are
patent.
IMPRESSION: No acute intracranial abnormality. No evidence of dural sinus
thrombosis.
